# Patient Record
Sex: Female | Born: 1975 | Race: Black or African American | Hispanic: No | Marital: Single | Smoking: Current every day smoker
Health system: Southern US, Community
[De-identification: ages and names within clinical notes are randomized; demographics above are authoritative.]

## PROBLEM LIST (undated history)

## (undated) DIAGNOSIS — K219 Gastro-esophageal reflux disease without esophagitis: Secondary | ICD-10-CM

## (undated) DIAGNOSIS — F32A Depression, unspecified: Secondary | ICD-10-CM

## (undated) DIAGNOSIS — M1990 Unspecified osteoarthritis, unspecified site: Secondary | ICD-10-CM

## (undated) DIAGNOSIS — E785 Hyperlipidemia, unspecified: Secondary | ICD-10-CM

## (undated) DIAGNOSIS — F419 Anxiety disorder, unspecified: Secondary | ICD-10-CM

## (undated) DIAGNOSIS — E119 Type 2 diabetes mellitus without complications: Secondary | ICD-10-CM

## (undated) DIAGNOSIS — R87629 Unspecified abnormal cytological findings in specimens from vagina: Secondary | ICD-10-CM

## (undated) DIAGNOSIS — J449 Chronic obstructive pulmonary disease, unspecified: Secondary | ICD-10-CM

## (undated) HISTORY — DX: Anxiety disorder, unspecified: F41.9

## (undated) HISTORY — DX: Unspecified osteoarthritis, unspecified site: M19.90

## (undated) HISTORY — DX: Gastro-esophageal reflux disease without esophagitis: K21.9

## (undated) HISTORY — DX: Type 2 diabetes mellitus without complications: E11.9

## (undated) HISTORY — PX: STRABISMUS SURGERY: SHX218

## (undated) HISTORY — DX: Unspecified abnormal cytological findings in specimens from vagina: R87.629

## (undated) HISTORY — DX: Chronic obstructive pulmonary disease, unspecified: J44.9

## (undated) HISTORY — DX: Hyperlipidemia, unspecified: E78.5

## (undated) HISTORY — DX: Depression, unspecified: F32.A

---

## 1999-01-23 ENCOUNTER — Encounter: Payer: Self-pay | Admitting: Emergency Medicine

## 1999-01-23 ENCOUNTER — Emergency Department (HOSPITAL_COMMUNITY): Admission: EM | Admit: 1999-01-23 | Discharge: 1999-01-23 | Payer: Self-pay | Admitting: Emergency Medicine

## 2000-07-03 ENCOUNTER — Encounter: Payer: Self-pay | Admitting: *Deleted

## 2000-07-03 ENCOUNTER — Inpatient Hospital Stay (HOSPITAL_COMMUNITY): Admission: EM | Admit: 2000-07-03 | Discharge: 2000-07-04 | Payer: Self-pay | Admitting: *Deleted

## 2004-11-14 ENCOUNTER — Emergency Department: Payer: Self-pay | Admitting: Internal Medicine

## 2005-05-21 ENCOUNTER — Emergency Department: Payer: Self-pay | Admitting: Emergency Medicine

## 2005-11-24 ENCOUNTER — Emergency Department (HOSPITAL_COMMUNITY): Admission: EM | Admit: 2005-11-24 | Discharge: 2005-11-24 | Payer: Self-pay | Admitting: Emergency Medicine

## 2007-08-18 ENCOUNTER — Emergency Department: Payer: Self-pay | Admitting: Emergency Medicine

## 2007-10-12 ENCOUNTER — Emergency Department: Payer: Self-pay | Admitting: Emergency Medicine

## 2008-01-01 ENCOUNTER — Observation Stay: Payer: Self-pay

## 2008-02-06 ENCOUNTER — Observation Stay: Payer: Self-pay | Admitting: Obstetrics & Gynecology

## 2008-02-20 ENCOUNTER — Observation Stay: Payer: Self-pay

## 2008-03-28 ENCOUNTER — Observation Stay: Payer: Self-pay | Admitting: Unknown Physician Specialty

## 2008-04-02 ENCOUNTER — Inpatient Hospital Stay: Payer: Self-pay

## 2009-06-22 ENCOUNTER — Emergency Department: Payer: Self-pay | Admitting: Unknown Physician Specialty

## 2009-08-30 ENCOUNTER — Emergency Department: Payer: Self-pay

## 2011-01-26 ENCOUNTER — Emergency Department: Payer: Self-pay | Admitting: Emergency Medicine
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
## 2011-11-22 ENCOUNTER — Emergency Department: Payer: Self-pay | Admitting: Emergency Medicine

## 2012-01-04 ENCOUNTER — Ambulatory Visit: Payer: Self-pay | Admitting: Family Medicine

## 2012-04-11 ENCOUNTER — Emergency Department: Payer: Self-pay | Admitting: Emergency Medicine

## 2012-04-11 LAB — URINALYSIS, COMPLETE
Bilirubin,UR: NEGATIVE
Glucose,UR: NEGATIVE mg/dL (ref 0–75)
Ketone: NEGATIVE
Nitrite: NEGATIVE
Ph: 6 (ref 4.5–8.0)
Protein: NEGATIVE
RBC,UR: <1 /HPF (ref 0–5)
Specific Gravity: 1.005 (ref 1.003–1.030)
Squamous Epithelial: 3
WBC UR: 1 /HPF (ref 0–5)

## 2012-04-11 LAB — CBC
HCT: 40.6 % (ref 35.0–47.0)
HGB: 13.8 g/dL (ref 12.0–16.0)
MCH: 33.1 pg (ref 26.0–34.0)
MCHC: 34 g/dL (ref 32.0–36.0)
MCV: 98 fL (ref 80–100)
Platelet: 216 10*3/uL (ref 150–440)
RBC: 4.17 10*6/uL (ref 3.80–5.20)
RDW: 12.6 % (ref 11.5–14.5)
WBC: 6.7 10*3/uL (ref 3.6–11.0)

## 2012-04-11 LAB — HCG, QUANTITATIVE, PREGNANCY: Beta Hcg, Quant.: <1 m[IU]/mL — ABNORMAL LOW

## 2014-03-27 IMAGING — US US PELV - US TRANSVAGINAL
1 series · 14 of 25 positions shown · non-contrast
Comparison: none

REASON FOR EXAM: Enlarged uterus
COMMENTS:

[Series 1: us pelv - us transvaginal · 0.28mm/px · 14 of 71 slices shown]
[im 1/71]
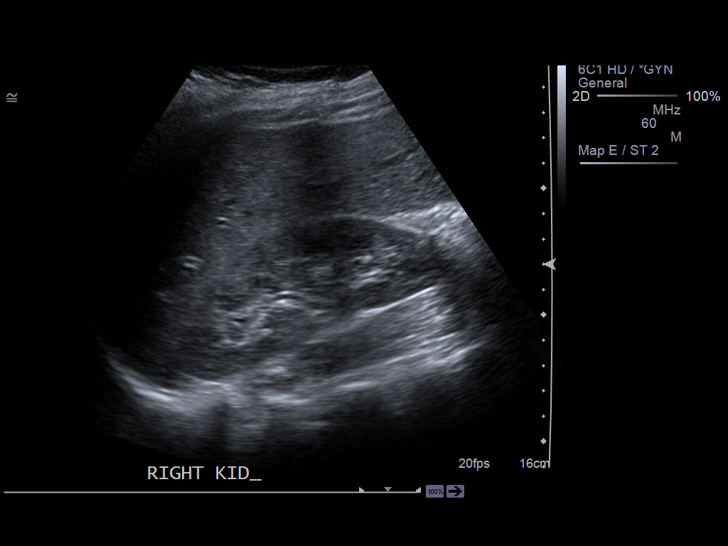
[im 6/71]
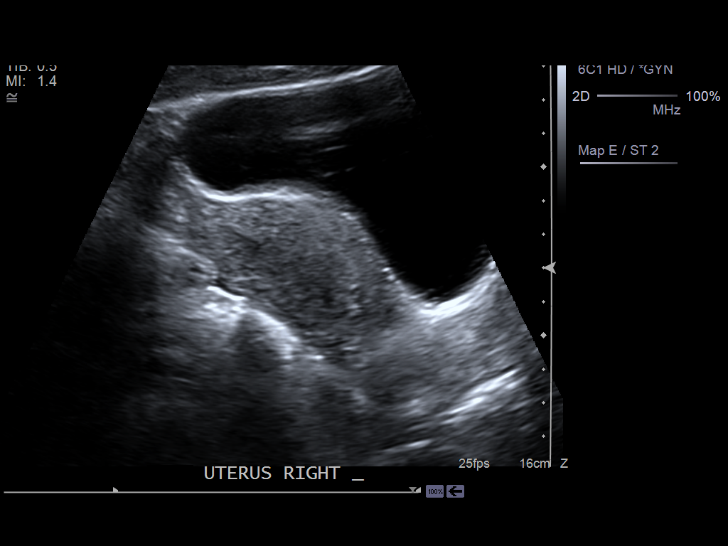
[im 12/71]
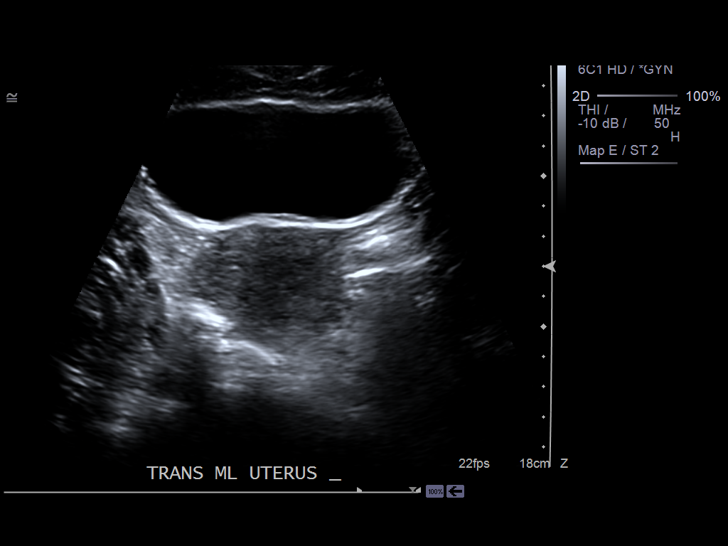
[im 18/71]
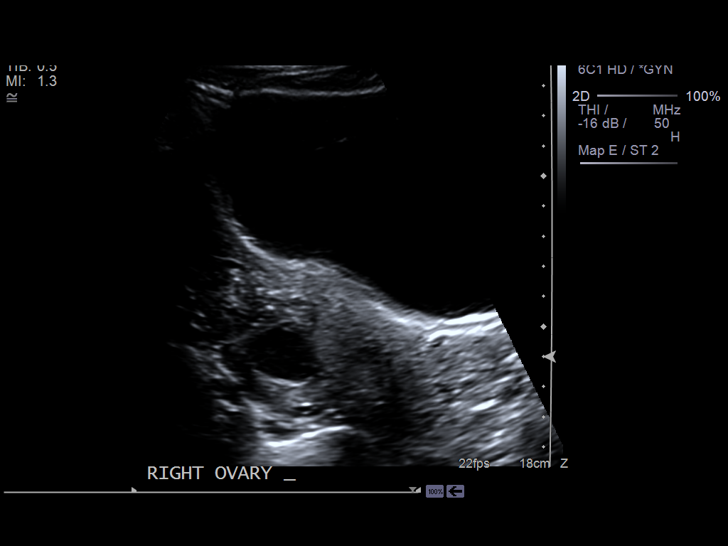
[im 24/71]
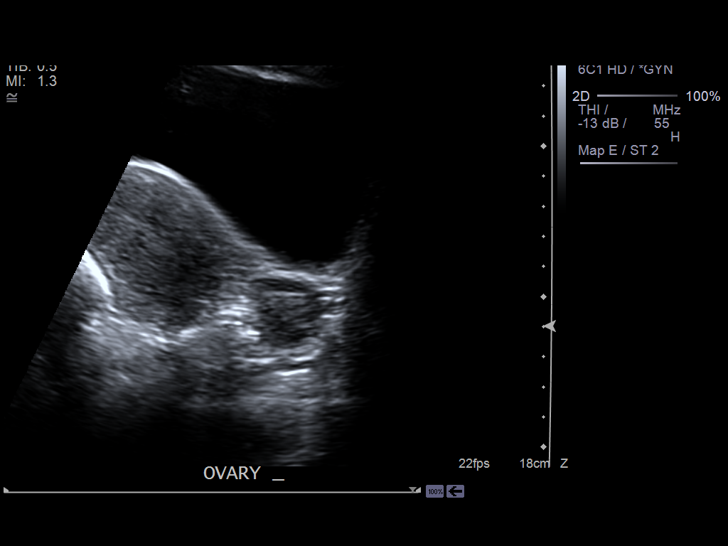
[im 27/71]
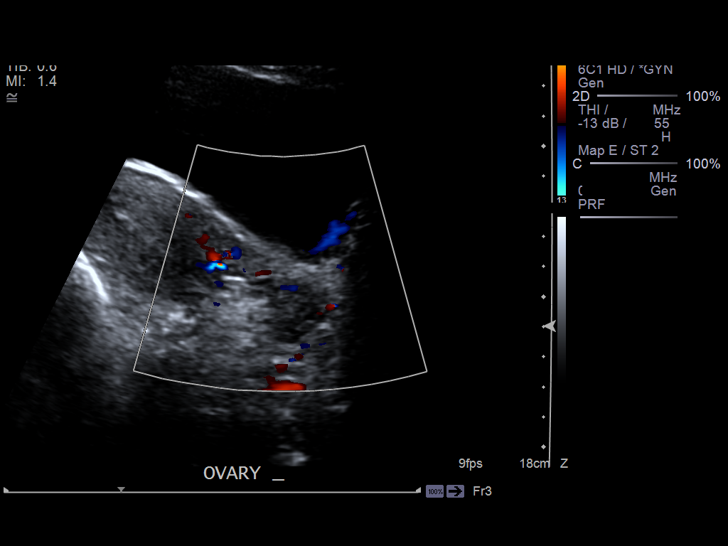
[im 33/71]
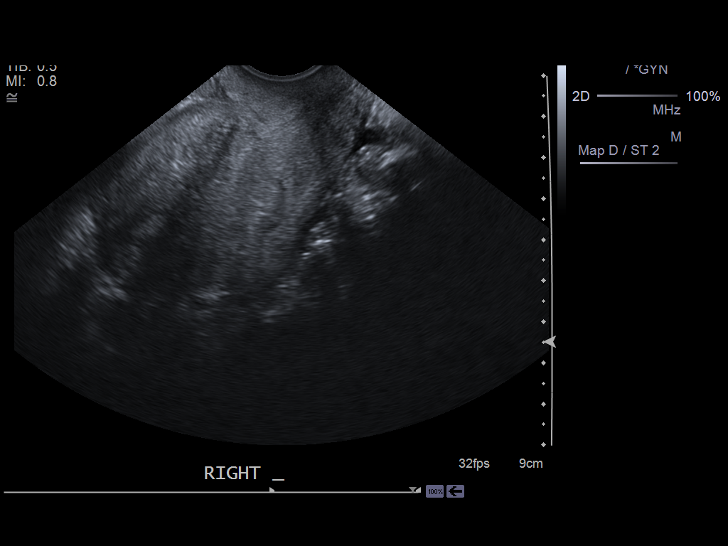
[im 38/71]
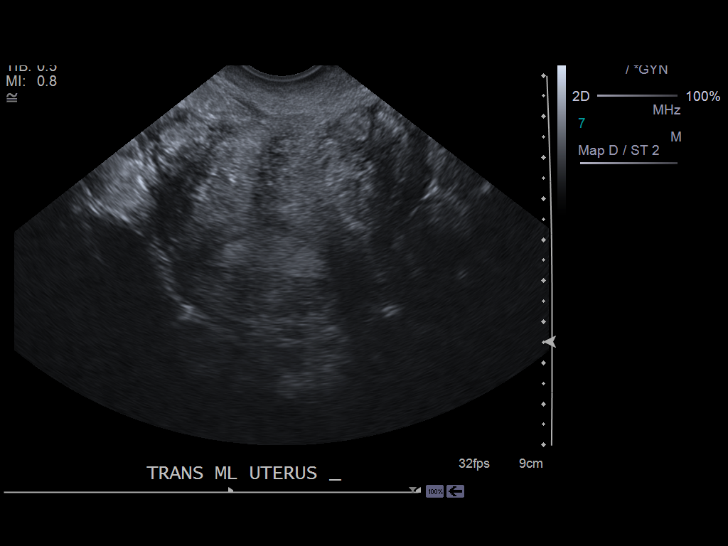
[im 44/71]
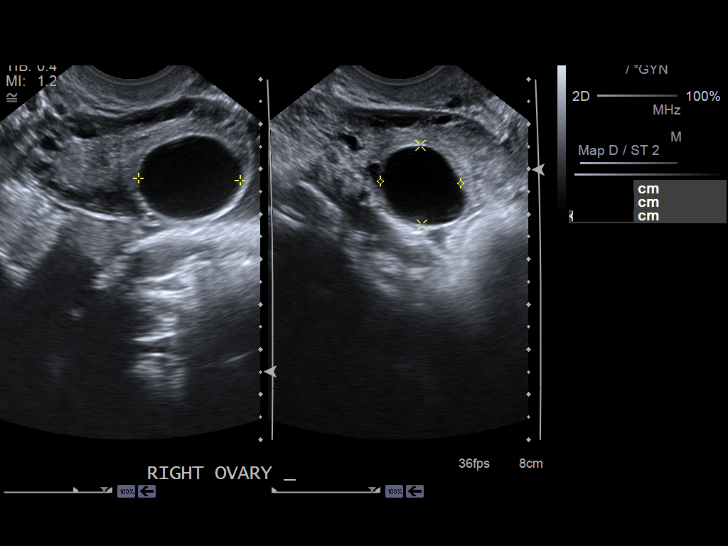
[im 47/71]
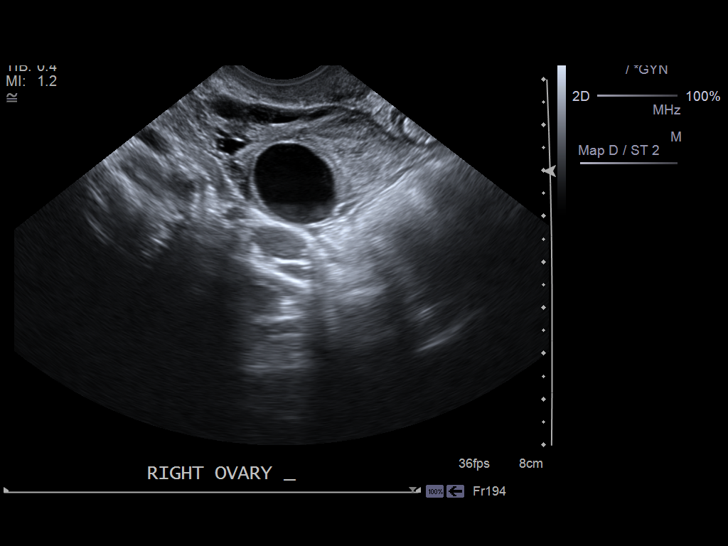
[im 53/71]
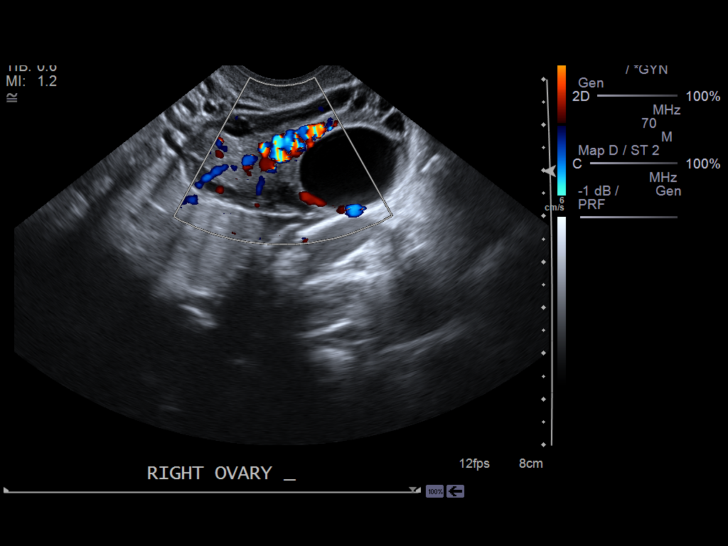
[im 59/71]
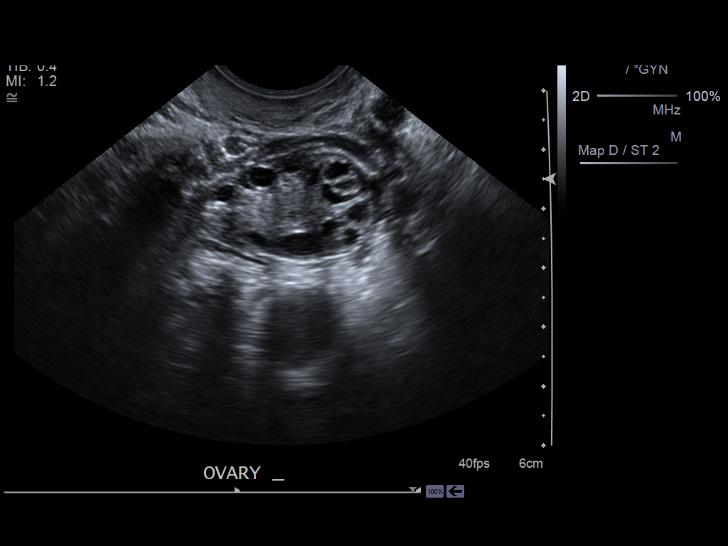
[im 65/71]
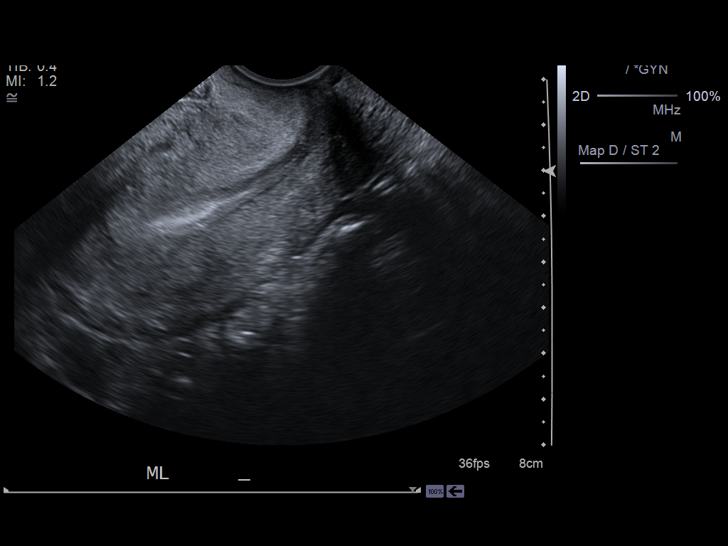
[im 71/71]
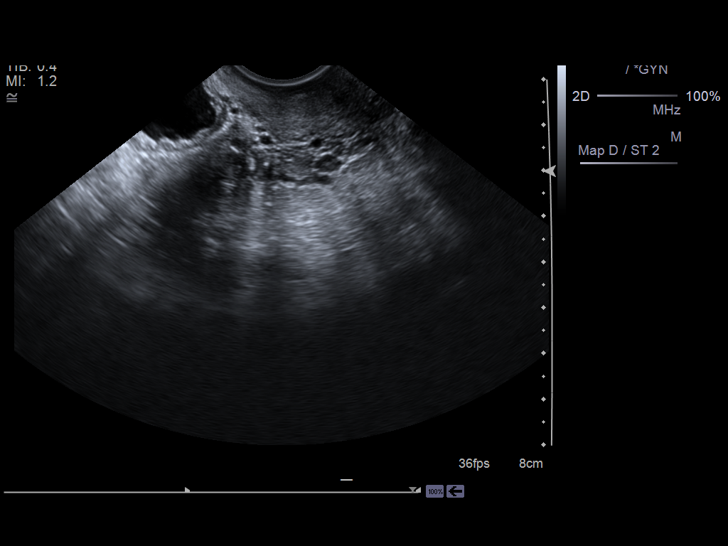

[14 of 25 positions shown; findings below may reference images not displayed]

PROCEDURE:     US  - US PELVIS EXAM W/TRANSVAGINAL  - January 04, 2012  [DATE]

RESULT:     Pelvic ultrasound reveals normal uterus measuring 9.8 x 5.6 x
3.9 cm with normal  endometrial thickness of 4.5 mm. Trace amount of fluid
noted in the pelvis. No significant ovarian abnormality identified. Normal
ovarian flow noted. Multiple bilateral follicular cysts noted with a
dominant follicular cyst in the right ovary measuring 2.3 cm. No
hydronephrosis.
IMPRESSION: No significant abnormality.

## 2023-03-18 ENCOUNTER — Telehealth: Payer: Self-pay | Admitting: Family Medicine

## 2023-03-18 NOTE — Telephone Encounter (Signed)
Tried to call the number back but unable to leave VM due to mailbox is full.  Dm/cma

## 2023-03-18 NOTE — Telephone Encounter (Signed)
Please call the pt she is concerned about her insurance ,medical records coming from [place] and her medication being refilled

## 2023-03-19 NOTE — Telephone Encounter (Signed)
I called and spoke with patient and she had concerns with her insurance starting on 03-21-23 and her appointment on 03-23-2023 could she be billed for services. I told her co-pay is due at time of service and will let speak more with our front staff. I spoke with Yolanda Olson- our front staff and asked her to talk with patient regarding this.

## 2023-03-23 ENCOUNTER — Other Ambulatory Visit (INDEPENDENT_AMBULATORY_CARE_PROVIDER_SITE_OTHER): Payer: Self-pay

## 2023-03-23 ENCOUNTER — Ambulatory Visit (INDEPENDENT_AMBULATORY_CARE_PROVIDER_SITE_OTHER): Payer: Self-pay | Admitting: Family Medicine

## 2023-03-23 ENCOUNTER — Encounter: Payer: Self-pay | Admitting: Family Medicine

## 2023-03-23 VITALS — BP 118/74 | HR 92 | Temp 97.7°F | Ht 67.25 in | Wt 174.6 lb

## 2023-03-23 DIAGNOSIS — I351 Nonrheumatic aortic (valve) insufficiency: Secondary | ICD-10-CM | POA: Insufficient documentation

## 2023-03-23 DIAGNOSIS — R87810 Cervical high risk human papillomavirus (HPV) DNA test positive: Secondary | ICD-10-CM

## 2023-03-23 DIAGNOSIS — I10 Essential (primary) hypertension: Secondary | ICD-10-CM

## 2023-03-23 DIAGNOSIS — G8929 Other chronic pain: Secondary | ICD-10-CM

## 2023-03-23 DIAGNOSIS — J4489 Other specified chronic obstructive pulmonary disease: Secondary | ICD-10-CM | POA: Insufficient documentation

## 2023-03-23 DIAGNOSIS — F419 Anxiety disorder, unspecified: Secondary | ICD-10-CM | POA: Insufficient documentation

## 2023-03-23 DIAGNOSIS — F259 Schizoaffective disorder, unspecified: Secondary | ICD-10-CM | POA: Insufficient documentation

## 2023-03-23 DIAGNOSIS — M1652 Unilateral post-traumatic osteoarthritis, left hip: Secondary | ICD-10-CM

## 2023-03-23 DIAGNOSIS — M169 Osteoarthritis of hip, unspecified: Secondary | ICD-10-CM | POA: Insufficient documentation

## 2023-03-23 DIAGNOSIS — Z7984 Long term (current) use of oral hypoglycemic drugs: Secondary | ICD-10-CM

## 2023-03-23 DIAGNOSIS — G43819 Other migraine, intractable, without status migrainosus: Secondary | ICD-10-CM | POA: Insufficient documentation

## 2023-03-23 DIAGNOSIS — M1612 Unilateral primary osteoarthritis, left hip: Secondary | ICD-10-CM | POA: Insufficient documentation

## 2023-03-23 DIAGNOSIS — K219 Gastro-esophageal reflux disease without esophagitis: Secondary | ICD-10-CM | POA: Insufficient documentation

## 2023-03-23 DIAGNOSIS — F909 Attention-deficit hyperactivity disorder, unspecified type: Secondary | ICD-10-CM | POA: Insufficient documentation

## 2023-03-23 DIAGNOSIS — F5101 Primary insomnia: Secondary | ICD-10-CM | POA: Insufficient documentation

## 2023-03-23 DIAGNOSIS — F17200 Nicotine dependence, unspecified, uncomplicated: Secondary | ICD-10-CM

## 2023-03-23 DIAGNOSIS — E1169 Type 2 diabetes mellitus with other specified complication: Secondary | ICD-10-CM | POA: Insufficient documentation

## 2023-03-23 DIAGNOSIS — N920 Excessive and frequent menstruation with regular cycle: Secondary | ICD-10-CM

## 2023-03-23 DIAGNOSIS — I517 Cardiomegaly: Secondary | ICD-10-CM | POA: Insufficient documentation

## 2023-03-23 DIAGNOSIS — M5442 Lumbago with sciatica, left side: Secondary | ICD-10-CM

## 2023-03-23 DIAGNOSIS — E785 Hyperlipidemia, unspecified: Secondary | ICD-10-CM | POA: Insufficient documentation

## 2023-03-23 LAB — URINALYSIS, ROUTINE W REFLEX MICROSCOPIC
Bilirubin Urine: NEGATIVE
Hgb urine dipstick: NEGATIVE
Ketones, ur: NEGATIVE
Leukocytes,Ua: NEGATIVE
Nitrite: NEGATIVE
RBC / HPF: NONE SEEN (ref 0–?)
Specific Gravity, Urine: 1.02 (ref 1.000–1.030)
Total Protein, Urine: NEGATIVE
Urine Glucose: NEGATIVE
Urobilinogen, UA: 0.2 (ref 0.0–1.0)
WBC, UA: NONE SEEN (ref 0–?)
pH: 6 (ref 5.0–8.0)

## 2023-03-23 LAB — MICROALBUMIN / CREATININE URINE RATIO
Creatinine,U: 64.3 mg/dL
Microalb Creat Ratio: 1.1 mg/g (ref 0.0–30.0)
Microalb, Ur: <0.7 mg/dL (ref 0.0–1.9)

## 2023-03-23 MED ORDER — VARENICLINE TARTRATE 0.5 MG PO TABS
ORAL_TABLET | ORAL | 0 refills | Status: AC
Start: 2023-03-23 — End: 2023-03-30

## 2023-03-23 MED ORDER — VARENICLINE TARTRATE 1 MG PO TABS
1.0000 mg | ORAL_TABLET | Freq: Two times a day (BID) | ORAL | 5 refills | Status: DC
Start: 2023-03-23 — End: 2023-08-20

## 2023-03-23 MED ORDER — OXYCODONE HCL 10 MG PO TABS
10.0000 mg | ORAL_TABLET | Freq: Three times a day (TID) | ORAL | 0 refills | Status: DC
Start: 2023-03-23 — End: 2023-04-23

## 2023-03-23 MED ORDER — AMPHETAMINE-DEXTROAMPHETAMINE 30 MG PO TABS
30.0000 mg | ORAL_TABLET | Freq: Two times a day (BID) | ORAL | 0 refills | Status: DC
Start: 2023-03-23 — End: 2023-04-16

## 2023-03-23 NOTE — Progress Notes (Deleted)
[facility] [address] [place] [address] Dept: [phone] Dept Fax: [phone]  Annual Physical Visit  Subjective:    Patient ID: Yolanda Olson, female    DOB: 1976-03-31, 47 y.o..   MRN: [id number]  Chief Complaint  Patient presents with   Establish Care    NP-establish care.     History of Present Illness:  Patient is in today for an annual physical/preventative visit.  Past Medical History: Patient Active Problem List   Diagnosis Date Noted   Chronic low back pain with left-sided sciatica 03/23/2023   Asthma-chronic obstructive pulmonary disease overlap syndrome 03/23/2023   Other migraine, intractable, without status migrainosus 03/23/2023   Essential hypertension 03/23/2023   Type 2 diabetes mellitus with other specified complication (HCC) 03/23/2023   Dyslipidemia 03/23/2023   Tobacco dependence 03/23/2023   Schizoaffective disorder (HCC) 03/23/2023   Primary insomnia 03/23/2023   Cervical high risk HPV (human papillomavirus) test positive 03/23/2023   Anxiety 03/23/2023   Adult ADHD 03/23/2023   GERD (gastroesophageal reflux disease) 03/23/2023   Mild concentric left ventricular hypertrophy (LVH) 03/23/2023   Aortic regurgitation 03/23/2023   Osteoarthritis of left hip 03/23/2023   Menorrhagia 03/23/2023   Past Surgical History:  Procedure Laterality Date   EYE SURGERY     Family History  Problem Relation Age of Onset   Hypertension Mother    Hyperlipidemia Mother    Diabetes Mother    Cancer Father        lung   Diabetes Father    Outpatient Medications Prior to Visit  Medication Sig Dispense Refill   albuterol (VENTOLIN HFA) 108 (90 Base) MCG/ACT inhaler Inhale 2 puffs into the lungs every 6 (six) hours as needed for wheezing or shortness of breath.     atorvastatin (LIPITOR) 40 MG tablet Take 40 mg by mouth daily.     budesonide-formoterol (SYMBICORT) 80-4.5 MCG/ACT inhaler Inhale 2  puffs into the lungs 2 (two) times daily.     cyclobenzaprine (FLEXERIL) 10 MG tablet Take 10 mg by mouth daily as needed for muscle spasms.     FLUoxetine (PROZAC) 40 MG capsule Take 40 mg by mouth daily.     gabapentin (NEURONTIN) 300 MG capsule Take 300 mg by mouth 3 (three) times daily.     hydrOXYzine (ATARAX) 25 MG tablet Take 25 mg by mouth every 6 (six) hours as needed for anxiety.     lisinopril (ZESTRIL) 2.5 MG tablet Take 2.5 mg by mouth daily.     meloxicam (MOBIC) 15 MG tablet Take 15 mg by mouth daily.     metFORMIN (GLUCOPHAGE-XR) 500 MG 24 hr tablet Take 500 mg by mouth 2 (two) times daily with a meal.     metroNIDAZOLE (FLAGYL) 500 MG tablet Take 500 mg by mouth 2 (two) times daily.     norethindrone (AYGESTIN) 5 MG tablet Take 5 mg by mouth daily.     omeprazole (PRILOSEC) 40 MG capsule Take 40 mg by mouth daily.     tiotropium (SPIRIVA) 18 MCG inhalation capsule Place 18 mcg into inhaler and inhale daily.     zolpidem (AMBIEN) 10 MG tablet Take 10 mg by mouth at bedtime as needed for sleep.     Oxycodone HCl 10 MG TABS Take 10 mg by mouth 4 (four) times daily.     No facility-administered medications prior to visit.   Allergies  Allergen Reactions   Aspirin Other (See Comments)    Stomach irritation  Objective:   Today's Vitals   03/23/23 1053  BP: 118/74  Pulse: 92  Temp: 97.7 F (36.5 C)  TempSrc: Temporal  SpO2: 100%  Weight: 174 lb 9.6 oz (79.2 kg)  Height: 5' 7.25" (1.708 m)   Body mass index is 27.14 kg/m.   General: Well developed, well nourished. No acute distress. HEENT: Normocephalic, non-traumatic. PERRL, EOMI. Conjunctiva clear. External ears normal. EAC and TMs normal bilaterally. Nose    clear without congestion or rhinorrhea. Mucous membranes moist. Oropharynx clear. Good dentition. Neck: Supple. No lymphadenopathy. No thyromegaly. Lungs: Clear to auscultation bilaterally. No wheezing, rales or rhonchi. CV: RRR without murmurs or rubs.  Pulses 2+ bilaterally. Abdomen: Soft, non-tender. Bowel sounds positive, normal pitch and frequency. No hepatosplenomegaly. No rebound or guarding. Back: Straight. No CVA tenderness bilaterally. Extremities: Full ROM. No joint swelling or tenderness. No edema noted. Skin: Warm and dry. No rashes. Neuro: CN II-XII intact. Normal sensation and DTR bilaterally. Psych: Alert and oriented. Normal mood and affect.  Health Maintenance Due  Topic Date Due   HEMOGLOBIN A1C  Never done   FOOT EXAM  Never done   OPHTHALMOLOGY EXAM  Never done   HIV Screening  Never done   Diabetic kidney evaluation - eGFR measurement  Never done   Diabetic kidney evaluation - Urine ACR  Never done   Hepatitis C Screening  Never done   PAP SMEAR-Modifier  Never done   DTaP/Tdap/Td (4 - Td or Tdap) 09/21/2016   INFLUENZA VACCINE  03/18/2023    Lab Results {Labs (Optional):29002}    Assessment & Plan:   Problem List Items Addressed This Visit       Cardiovascular and Mediastinum   Essential hypertension - Primary   Relevant Medications   atorvastatin (LIPITOR) 40 MG tablet   lisinopril (ZESTRIL) 2.5 MG tablet     Endocrine   Type 2 diabetes mellitus with other specified complication (HCC)   Relevant Medications   atorvastatin (LIPITOR) 40 MG tablet   lisinopril (ZESTRIL) 2.5 MG tablet   metFORMIN (GLUCOPHAGE-XR) 500 MG 24 hr tablet   Other Relevant Orders   Lipid panel   Microalbumin / creatinine urine ratio   Basic metabolic panel   Hemoglobin A1c   Urinalysis, Routine w reflex microscopic     Nervous and Auditory   Chronic low back pain with left-sided sciatica   Relevant Medications   hydrOXYzine (ATARAX) 25 MG tablet   gabapentin (NEURONTIN) 300 MG capsule   cyclobenzaprine (FLEXERIL) 10 MG tablet   FLUoxetine (PROZAC) 40 MG capsule   zolpidem (AMBIEN) 10 MG tablet   meloxicam (MOBIC) 15 MG tablet   Oxycodone HCl 10 MG TABS   amphetamine-dextroamphetamine (ADDERALL) 30 MG tablet    varenicline (CHANTIX) 0.5 MG tablet   varenicline (CHANTIX) 1 MG tablet   Other Relevant Orders   Urine drugs of abuse scrn w alc, routine ([facility] [place] [facility])   Ambulatory referral to Orthopedics   Ambulatory referral to Pain Clinic     Musculoskeletal and Integument   Osteoarthritis of left hip   Relevant Medications   cyclobenzaprine (FLEXERIL) 10 MG tablet   meloxicam (MOBIC) 15 MG tablet   Oxycodone HCl 10 MG TABS   Other Relevant Orders   Urine drugs of abuse scrn w alc, routine ([facility] [place] [facility])   Ambulatory referral to Orthopedics   Ambulatory referral to Pain Clinic     Other   Adult ADHD   Relevant Medications   amphetamine-dextroamphetamine (  ADDERALL) 30 MG tablet   Cervical high risk HPV (human papillomavirus) test positive   Relevant Orders   Ambulatory referral to Obstetrics / Gynecology   Menorrhagia   Relevant Orders   Ambulatory referral to Obstetrics / Gynecology   Tobacco dependence   Relevant Medications   varenicline (CHANTIX) 0.5 MG tablet   varenicline (CHANTIX) 1 MG tablet    Return in about 3 months (around 06/23/2023) for Reassessment.   Loyola Mast, MD

## 2023-03-23 NOTE — Assessment & Plan Note (Signed)
I will plan to refer her to a pain clinic regarding her ongoing COTS. I will bridge her for now with oxycodone. I did review records form ehr previous provider, Dr. Brooke Dare, confirming that she has been on this medication. I will check a UDS today.

## 2023-03-23 NOTE — Assessment & Plan Note (Signed)
Blood pressure is in good control. Continue lisinopril 2.5 mg daily.
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
## 2023-03-23 NOTE — Assessment & Plan Note (Signed)
I will continue her Adderall 30 mg bid, confirmed by review of prior records.

## 2023-03-23 NOTE — Assessment & Plan Note (Signed)
I support her desire to quit smoking. I will prescribed Chantix.

## 2023-03-23 NOTE — Assessment & Plan Note (Signed)
Continue norethindrone. I will refer Ms. Dubbs to establish with a GYN locally.

## 2023-03-23 NOTE — Assessment & Plan Note (Signed)
I will check annual DM labs today. We will continue metformin 500 mg 2 tabs bid. She is interested in doing her eye screen in Oct. here in our clinic.

## 2023-03-23 NOTE — Assessment & Plan Note (Addendum)
I will refer her to establish with a local orthopedist regarding her chronic hip arthritis. Pain management as noted above.

## 2023-03-25 ENCOUNTER — Telehealth: Payer: Self-pay | Admitting: Family Medicine

## 2023-03-25 MED ORDER — FREESTYLE LITE TEST VI STRP: 1.0000 | ORAL_STRIP | 3 refills | Status: AC | PRN

## 2023-03-25 NOTE — Telephone Encounter (Signed)
Prescription Request  03/25/2023  LOV: 03/23/2023  What is the name of the medication or equipment? Freestyle Test strips 100ct (Diabetic test strip)  Have you contacted your pharmacy to request a refill? No   Which pharmacy would you like this sent to?  [facility] [phone] - [place], [place] - [address] AT [facility] [address] [place] [address] Phone: [phone] Fax: [phone]    Patient notified that their request is being sent to the clinical staff for review and that they should receive a response within 2 business days.   Please advise at Mobile [phone] (mobile)

## 2023-03-25 NOTE — Telephone Encounter (Signed)
RX sent to the pharmacy. Dm/cma  

## 2023-03-30 ENCOUNTER — Telehealth: Payer: Self-pay | Admitting: Family Medicine

## 2023-03-30 NOTE — Telephone Encounter (Signed)
Pt is needing the frequency added to her glucose blood (FREESTYLE LITE) test strip [[id number]]. They will not fill it. Pt at [phone]   [id number] - [place], [place] - [address] AT [facility] OF [address] [place] [address] Phone: [phone]  Fax: [phone] DEA #: [id number]

## 2023-03-30 NOTE — Telephone Encounter (Signed)
Called pharmacy and gave verbal to check sugars once daily.  They will fill the prescription.  Dm/cma

## 2023-04-05 ENCOUNTER — Ambulatory Visit: Payer: Self-pay | Admitting: Family Medicine

## 2023-04-08 ENCOUNTER — Ambulatory Visit: Payer: Medicaid Other | Admitting: Orthopaedic Surgery

## 2023-04-15 ENCOUNTER — Telehealth: Payer: Self-pay | Admitting: Family Medicine

## 2023-04-15 DIAGNOSIS — I10 Essential (primary) hypertension: Secondary | ICD-10-CM

## 2023-04-15 DIAGNOSIS — M1652 Unilateral post-traumatic osteoarthritis, left hip: Secondary | ICD-10-CM

## 2023-04-15 DIAGNOSIS — F419 Anxiety disorder, unspecified: Secondary | ICD-10-CM

## 2023-04-15 DIAGNOSIS — E1169 Type 2 diabetes mellitus with other specified complication: Secondary | ICD-10-CM

## 2023-04-15 DIAGNOSIS — K219 Gastro-esophageal reflux disease without esophagitis: Secondary | ICD-10-CM

## 2023-04-15 DIAGNOSIS — F5101 Primary insomnia: Secondary | ICD-10-CM

## 2023-04-15 DIAGNOSIS — J4489 Other specified chronic obstructive pulmonary disease: Secondary | ICD-10-CM

## 2023-04-15 DIAGNOSIS — N920 Excessive and frequent menstruation with regular cycle: Secondary | ICD-10-CM

## 2023-04-15 DIAGNOSIS — F909 Attention-deficit hyperactivity disorder, unspecified type: Secondary | ICD-10-CM

## 2023-04-15 DIAGNOSIS — G8929 Other chronic pain: Secondary | ICD-10-CM

## 2023-04-15 DIAGNOSIS — E785 Hyperlipidemia, unspecified: Secondary | ICD-10-CM

## 2023-04-15 NOTE — Telephone Encounter (Signed)
Pt is requesting that all her meds on her med list be called in to [facility] on [address].  They have told her they have none on file because they were all [place] of [place].

## 2023-04-15 NOTE — Telephone Encounter (Signed)
Called patient and she needs refills on all her medication including if you would please send the Adderall in so she doesn't have to call back in a week.  Please review message and advise. Dm/cma

## 2023-04-15 NOTE — Telephone Encounter (Signed)
[place] [phone]  Pt wants her referral to go to [facility]  Fax# [phone] Gus Puma She has requested a call because she is almost out of her pain meds.  She now has one to [facility].

## 2023-04-16 MED ORDER — BUDESONIDE-FORMOTEROL FUMARATE 80-4.5 MCG/ACT IN AERO: 2.0000 | INHALATION_SPRAY | Freq: Two times a day (BID) | RESPIRATORY_TRACT | 11 refills | Status: DC

## 2023-04-16 MED ORDER — OMEPRAZOLE 40 MG PO CPDR
40.0000 mg | DELAYED_RELEASE_CAPSULE | Freq: Every day | ORAL | 3 refills | Status: AC
Start: 2023-04-16 — End: ?

## 2023-04-16 MED ORDER — AMPHETAMINE-DEXTROAMPHETAMINE 30 MG PO TABS
30.0000 mg | ORAL_TABLET | Freq: Two times a day (BID) | ORAL | 0 refills | Status: AC
Start: 2023-04-16 — End: ?

## 2023-04-16 MED ORDER — ALBUTEROL SULFATE HFA 108 (90 BASE) MCG/ACT IN AERS
2.0000 | INHALATION_SPRAY | Freq: Four times a day (QID) | RESPIRATORY_TRACT | 5 refills | Status: DC | PRN
Start: 2023-04-16 — End: 2024-05-30

## 2023-04-16 MED ORDER — NORETHINDRONE ACETATE 5 MG PO TABS
5.0000 mg | ORAL_TABLET | Freq: Every day | ORAL | 3 refills | Status: DC
Start: 2023-04-16 — End: 2023-12-06

## 2023-04-16 MED ORDER — ZOLPIDEM TARTRATE 10 MG PO TABS
10.0000 mg | ORAL_TABLET | Freq: Every evening | ORAL | 1 refills | Status: AC | PRN
Start: 2023-04-16 — End: ?

## 2023-04-16 MED ORDER — LISINOPRIL 2.5 MG PO TABS
2.5000 mg | ORAL_TABLET | Freq: Every day | ORAL | 3 refills | Status: DC
Start: 2023-04-16 — End: 2024-06-19

## 2023-04-16 MED ORDER — CYCLOBENZAPRINE HCL 10 MG PO TABS
10.0000 mg | ORAL_TABLET | Freq: Every day | ORAL | 3 refills | Status: DC | PRN
Start: 2023-04-16 — End: 2023-12-13

## 2023-04-16 MED ORDER — ATORVASTATIN CALCIUM 40 MG PO TABS
40.0000 mg | ORAL_TABLET | Freq: Every day | ORAL | 3 refills | Status: DC
Start: 2023-04-16 — End: 2024-03-30

## 2023-04-16 MED ORDER — METFORMIN HCL ER 500 MG PO TB24
500.0000 mg | ORAL_TABLET | Freq: Two times a day (BID) | ORAL | 3 refills | Status: DC
Start: 2023-04-16 — End: 2024-06-19

## 2023-04-16 MED ORDER — TIOTROPIUM BROMIDE MONOHYDRATE 18 MCG IN CAPS
18.0000 ug | ORAL_CAPSULE | Freq: Every day | RESPIRATORY_TRACT | 3 refills | Status: DC
Start: 2023-04-16 — End: 2023-08-20

## 2023-04-16 MED ORDER — GABAPENTIN 300 MG PO CAPS
300.0000 mg | ORAL_CAPSULE | Freq: Three times a day (TID) | ORAL | 3 refills | Status: AC
Start: 2023-04-16 — End: ?

## 2023-04-16 MED ORDER — FLUOXETINE HCL 40 MG PO CAPS
40.0000 mg | ORAL_CAPSULE | Freq: Every day | ORAL | 3 refills | Status: DC
Start: 2023-04-16 — End: 2023-08-20

## 2023-04-16 NOTE — Telephone Encounter (Signed)
Referral has been sent to [facility], to the fax number provided.

## 2023-04-16 NOTE — Addendum Note (Signed)
Addended by: Loyola Mast on: 04/16/2023 08:22 AM   Modules accepted: Orders

## 2023-04-20 MED ORDER — BLOOD GLUCOSE METER KIT: 1.0000 | PACK | 0 refills | Status: DC

## 2023-04-20 MED ORDER — BLOOD GLUCOSE METER KIT: 1.0000 | PACK | 0 refills | Status: AC

## 2023-04-20 NOTE — Addendum Note (Signed)
Addended by: Mickle Plumb L on: 04/20/2023 12:00 PM   Modules accepted: Orders

## 2023-04-20 NOTE — Addendum Note (Signed)
Addended by: Mickle Plumb L on: 04/20/2023 12:01 PM   Modules accepted: Orders

## 2023-04-20 NOTE — Telephone Encounter (Signed)
Pt's insurance will not cover for her to get Freestyle Lite, they are wanting her to get the Accucoat machine with the test strips.

## 2023-04-23 MED ORDER — OXYCODONE HCL 10 MG PO TABS: 10.0000 mg | ORAL_TABLET | Freq: Three times a day (TID) | ORAL | 0 refills | Status: DC

## 2023-04-23 NOTE — Addendum Note (Signed)
Addended by: Loyola Mast on: 04/23/2023 03:02 PM   Modules accepted: Orders

## 2023-04-23 NOTE — Telephone Encounter (Signed)
Pt is saying [facility] does not have this referral. Please resend.     Pt is also wanting a refill on her Oxycodone HCl 10 MG TABS [[id number]]  [id number] - [place], [place] - [address] AT [facility] [address] [place] [address] Phone: [phone]  Fax: [phone] DEA #: [id number]   For one more month until she gets an appt with pain management.

## 2023-04-23 NOTE — Telephone Encounter (Signed)
Sherri: Can you re-send the referral?  Dr Veto Kemps: Refill on Oxycodone.   Please review and advise.  Thanks Dm/cma

## 2023-04-23 NOTE — Telephone Encounter (Signed)
Patient notified VIA phone.   She is now asking for a refill on Ozempic 1mg .   Please review and advise.  Thanks. Dm/cma

## 2023-04-26 ENCOUNTER — Other Ambulatory Visit: Payer: Self-pay

## 2023-04-26 DIAGNOSIS — M1652 Unilateral post-traumatic osteoarthritis, left hip: Secondary | ICD-10-CM

## 2023-04-26 DIAGNOSIS — G8929 Other chronic pain: Secondary | ICD-10-CM

## 2023-04-26 NOTE — Telephone Encounter (Signed)
Resent

## 2023-04-26 NOTE — Telephone Encounter (Signed)
Patient  requesting Rx refill

## 2023-04-26 NOTE — Telephone Encounter (Signed)
Lft VM to rtn call. Dm/cma  

## 2023-04-27 ENCOUNTER — Telehealth: Payer: Self-pay

## 2023-04-27 NOTE — Telephone Encounter (Signed)
RX needed a PA.  It has been submitted through cover my meds.   Dm/cma

## 2023-04-27 NOTE — Telephone Encounter (Signed)
Lft VM to rtn call. Dm/cma  

## 2023-04-27 NOTE — Telephone Encounter (Signed)
PA for Oxycodone submitted through cover my meds.  Awaiting response.  Dm/cma  Key: [id number]

## 2023-04-28 NOTE — Telephone Encounter (Signed)
Lft VM to rtn call. Dm/cma  

## 2023-05-05 LAB — LAB REPORT - SCANNED
A1c: 5.6
Creatinine, POC: 57.5 mg/dL
EGFR: 154
HM Hepatitis Screen: NEGATIVE

## 2023-05-20 ENCOUNTER — Telehealth: Payer: Self-pay

## 2023-05-20 ENCOUNTER — Encounter: Payer: Self-pay | Admitting: Family Medicine

## 2023-05-20 ENCOUNTER — Ambulatory Visit: Payer: Medicaid Other | Admitting: Family Medicine

## 2023-05-20 VITALS — BP 134/86 | HR 90 | Temp 97.2°F | Ht 67.25 in | Wt 176.6 lb

## 2023-05-20 DIAGNOSIS — E1169 Type 2 diabetes mellitus with other specified complication: Secondary | ICD-10-CM | POA: Diagnosis not present

## 2023-05-20 DIAGNOSIS — T7491XA Unspecified adult maltreatment, confirmed, initial encounter: Secondary | ICD-10-CM

## 2023-05-20 DIAGNOSIS — Z3202 Encounter for pregnancy test, result negative: Secondary | ICD-10-CM

## 2023-05-20 DIAGNOSIS — I10 Essential (primary) hypertension: Secondary | ICD-10-CM | POA: Diagnosis not present

## 2023-05-20 DIAGNOSIS — M5442 Lumbago with sciatica, left side: Secondary | ICD-10-CM | POA: Diagnosis not present

## 2023-05-20 DIAGNOSIS — N760 Acute vaginitis: Secondary | ICD-10-CM

## 2023-05-20 DIAGNOSIS — Z7984 Long term (current) use of oral hypoglycemic drugs: Secondary | ICD-10-CM

## 2023-05-20 DIAGNOSIS — B9689 Other specified bacterial agents as the cause of diseases classified elsewhere: Secondary | ICD-10-CM | POA: Insufficient documentation

## 2023-05-20 DIAGNOSIS — M170 Bilateral primary osteoarthritis of knee: Secondary | ICD-10-CM

## 2023-05-20 DIAGNOSIS — M1652 Unilateral post-traumatic osteoarthritis, left hip: Secondary | ICD-10-CM

## 2023-05-20 DIAGNOSIS — Z7985 Long-term (current) use of injectable non-insulin antidiabetic drugs: Secondary | ICD-10-CM

## 2023-05-20 DIAGNOSIS — K219 Gastro-esophageal reflux disease without esophagitis: Secondary | ICD-10-CM

## 2023-05-20 DIAGNOSIS — E785 Hyperlipidemia, unspecified: Secondary | ICD-10-CM

## 2023-05-20 DIAGNOSIS — G8929 Other chronic pain: Secondary | ICD-10-CM

## 2023-05-20 DIAGNOSIS — F909 Attention-deficit hyperactivity disorder, unspecified type: Secondary | ICD-10-CM

## 2023-05-20 LAB — POCT URINE PREGNANCY: Preg Test, Ur: NEGATIVE

## 2023-05-20 MED ORDER — AMPHETAMINE-DEXTROAMPHETAMINE 30 MG PO TABS
30.0000 mg | ORAL_TABLET | Freq: Two times a day (BID) | ORAL | 0 refills | Status: DC
Start: 2023-05-20 — End: 2023-06-18

## 2023-05-20 MED ORDER — METRONIDAZOLE 500 MG PO TABS
500.0000 mg | ORAL_TABLET | Freq: Two times a day (BID) | ORAL | 2 refills | Status: DC
Start: 2023-05-20 — End: 2023-08-20

## 2023-05-20 MED ORDER — OZEMPIC (1 MG/DOSE) 4 MG/3ML ~~LOC~~ SOPN
1.0000 mg | PEN_INJECTOR | SUBCUTANEOUS | 3 refills | Status: DC
Start: 2023-05-20 — End: 2024-03-30

## 2023-05-20 MED ORDER — OMEPRAZOLE 40 MG PO CPDR: 40.0000 mg | DELAYED_RELEASE_CAPSULE | Freq: Every day | ORAL | 3 refills | Status: DC
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
## 2023-05-20 NOTE — Assessment & Plan Note (Signed)
Lipids are at goal. Continue atorvastatin 40 mg daily.

## 2023-05-20 NOTE — Assessment & Plan Note (Signed)
A1c in good control. We will continue metformin 500 mg 2 tabs bid and Ozempic 1 mg weekly.
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
## 2023-05-20 NOTE — Assessment & Plan Note (Signed)
Patient notes issue with recurrent BV around the time of her menses. She takes metronidazole as needed for several days to stop the discharge. I recommend she discuss this with GYN once she establishes care.

## 2023-05-20 NOTE — Telephone Encounter (Signed)
Pt called the office to report she is ok.

## 2023-05-20 NOTE — Telephone Encounter (Signed)
[facility] spoke with pt otp. Pt advised them that she was safe and ok.

## 2023-05-20 NOTE — Assessment & Plan Note (Signed)
Patient would be very high risk for pregnancy due to advanced age, hypertension, diabetes, and chronic opioid therapy. I reviewed birth control options and recommended she consider non-hormonal approaches, such as Paraguard IUD or sterilization procedures. I provided her a handout of these options and recommended she discuss further with gynecology.

## 2023-05-20 NOTE — Assessment & Plan Note (Signed)
Continue to follow with [facility] clinic regarding COTS. I will try referring her back to orthopedics.

## 2023-05-20 NOTE — Assessment & Plan Note (Signed)
Reviewed x-ray report from [place]. Shows severe left hip arthritis. I will try and refer her again to orthopedics.
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
## 2023-05-20 NOTE — Assessment & Plan Note (Signed)
Reviewed x-ray report from [place]. Shows tricompartmental knee arthritis bilaterally. I will try and refer her again to orthopedics.

## 2023-05-20 NOTE — Assessment & Plan Note (Signed)
Stable. Continue Adderall 30 mg bid.
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
## 2023-05-20 NOTE — Assessment & Plan Note (Signed)
Stable.  Continue  omeprazole 40mg daily

## 2023-06-01 ENCOUNTER — Ambulatory Visit: Payer: Medicaid Other | Admitting: Orthopaedic Surgery

## 2023-06-18 ENCOUNTER — Other Ambulatory Visit: Payer: Self-pay | Admitting: Family Medicine

## 2023-06-18 DIAGNOSIS — F909 Attention-deficit hyperactivity disorder, unspecified type: Secondary | ICD-10-CM

## 2023-06-18 MED ORDER — AMPHETAMINE-DEXTROAMPHETAMINE 30 MG PO TABS: 30.0000 mg | ORAL_TABLET | Freq: Two times a day (BID) | ORAL | 0 refills | Status: DC

## 2023-06-18 NOTE — Telephone Encounter (Signed)
Patient notified VIA phone. Dm/cma  

## 2023-06-18 NOTE — Telephone Encounter (Signed)
Refill request for  Adderall 30 mg LR  05/20/23, #60, 0 rf LOV 05/20/23 FOV 08/20/23  Please review and advise.  Thanks. Dm/cma

## 2023-06-18 NOTE — Telephone Encounter (Signed)
Prescription Request  06/18/2023  LOV: 05/20/2023  What is the name of the medication or equipment? amphetamine-dextroamphetamine (ADDERALL) 30 ,  Have you contacted your pharmacy to request a refill? Yes   Which pharmacy would you like this sent to?  [facility] [phone] - [place], [place] - [address] AT [facility] [address] [place] [address] Phone: [phone] Fax: [phone]    Patient notified that their request is being sent to the clinical staff for review and that they should receive a response within 2 business days.   Please advise at Mobile There is no such number on file (mobile).

## 2023-06-22 ENCOUNTER — Telehealth: Payer: Self-pay | Admitting: Family Medicine

## 2023-06-22 NOTE — Telephone Encounter (Signed)
PA submitted through cover my meds.  Awaiting response. Dm/cma   Key: [id number]

## 2023-06-22 NOTE — Telephone Encounter (Signed)
Pt is checking the status of the PA for her Ozempic.

## 2023-06-23 NOTE — Telephone Encounter (Signed)
PA approved from 06/22/23 - 06/21/24.  Pharmacy notified and patient. Dm/cma

## 2023-07-19 ENCOUNTER — Other Ambulatory Visit: Payer: Self-pay | Admitting: Family Medicine

## 2023-07-19 ENCOUNTER — Telehealth: Payer: Self-pay | Admitting: Family Medicine

## 2023-07-19 DIAGNOSIS — F909 Attention-deficit hyperactivity disorder, unspecified type: Secondary | ICD-10-CM

## 2023-07-19 MED ORDER — HYDROXYZINE HCL 25 MG PO TABS: 25.0000 mg | ORAL_TABLET | Freq: Four times a day (QID) | ORAL | 5 refills | Status: DC | PRN

## 2023-07-19 MED ORDER — AMPHETAMINE-DEXTROAMPHETAMINE 30 MG PO TABS: 30.0000 mg | ORAL_TABLET | Freq: Two times a day (BID) | ORAL | 0 refills | Status: DC

## 2023-07-19 NOTE — Telephone Encounter (Signed)
Refill request for  Adderall 30 mg LR 06/18/23, #60, 0 rf  Hydroxyzine 25 mg LR  hx provider LOV   05/20/2023 FOV  07/27/23  Fluoxetine 40 mg was sent 04/16/23 #90, 3 rf  (not needed)  Please review and advise.  Thanks Dm/cma

## 2023-07-19 NOTE — Telephone Encounter (Signed)
Refill request  Pt takes last pill today.  FLUoxetine (PROZAC) 40 MG capsule [[id number]]   [id number] - [place], [place] - [address] AT [facility] [address] [place] [address] Phone: [phone]  Fax: [phone]

## 2023-07-19 NOTE — Telephone Encounter (Signed)
Refill request  amphetamine-dextroamphetamine (ADDERALL) 30 MG tablet [[id number]]  FLUoxetine (PROZAC) 40 MG capsule [[id number]]  hydrOXYzine (ATARAX) 25 MG tablet [[id number]]    [id number] - [place], [place] - [address] AT [facility] [address] [place] [address] Phone: [phone]  Fax: [phone]

## 2023-07-20 NOTE — Telephone Encounter (Signed)
Lft VM that RX's have been sent to the pharmacy.  Dm/cma

## 2023-07-20 NOTE — Telephone Encounter (Signed)
Called pharmacy and they already have RX ready for her to pick up.  They did say they spoke to her already as well. Dm/cma

## 2023-07-27 ENCOUNTER — Ambulatory Visit: Payer: Medicaid Other | Admitting: Family Medicine

## 2023-07-27 ENCOUNTER — Telehealth: Payer: Self-pay | Admitting: Family Medicine

## 2023-07-27 NOTE — Telephone Encounter (Signed)
Pt did not show up for appt today. Pls advise.

## 2023-07-27 NOTE — Telephone Encounter (Signed)
1st no show, text sent, letter sent via mail

## 2023-07-30 ENCOUNTER — Other Ambulatory Visit: Payer: Self-pay | Admitting: Family Medicine

## 2023-07-30 DIAGNOSIS — E1169 Type 2 diabetes mellitus with other specified complication: Secondary | ICD-10-CM

## 2023-07-30 DIAGNOSIS — N760 Acute vaginitis: Secondary | ICD-10-CM

## 2023-07-30 NOTE — Telephone Encounter (Signed)
Refill request  metroNIDAZOLE (FLAGYL) 500 MG tablet [[id number]]   [id number] - [place], [place] - [address] AT [facility] [address] [place] [address] Phone: [phone]  Fax: [phone]

## 2023-07-30 NOTE — Telephone Encounter (Signed)
Unable to leave VM to rtn call.   Dm/cma

## 2023-08-02 NOTE — Telephone Encounter (Signed)
Lft VM to rtn call. Dm/cma  

## 2023-08-03 NOTE — Telephone Encounter (Signed)
Spoke to patient and she will wait till her appointment on 08/20/23. Dm/cma

## 2023-08-20 ENCOUNTER — Ambulatory Visit: Payer: Medicaid Other | Admitting: Family Medicine

## 2023-08-20 VITALS — BP 124/80 | HR 82 | Temp 97.7°F | Ht 67.25 in | Wt 177.4 lb

## 2023-08-20 DIAGNOSIS — E785 Hyperlipidemia, unspecified: Secondary | ICD-10-CM | POA: Diagnosis not present

## 2023-08-20 DIAGNOSIS — I10 Essential (primary) hypertension: Secondary | ICD-10-CM | POA: Diagnosis not present

## 2023-08-20 DIAGNOSIS — Z7985 Long-term (current) use of injectable non-insulin antidiabetic drugs: Secondary | ICD-10-CM | POA: Diagnosis not present

## 2023-08-20 DIAGNOSIS — Z7984 Long term (current) use of oral hypoglycemic drugs: Secondary | ICD-10-CM | POA: Diagnosis not present

## 2023-08-20 DIAGNOSIS — J069 Acute upper respiratory infection, unspecified: Secondary | ICD-10-CM | POA: Diagnosis not present

## 2023-08-20 DIAGNOSIS — E1169 Type 2 diabetes mellitus with other specified complication: Secondary | ICD-10-CM | POA: Diagnosis not present

## 2023-08-20 DIAGNOSIS — F419 Anxiety disorder, unspecified: Secondary | ICD-10-CM

## 2023-08-20 DIAGNOSIS — F909 Attention-deficit hyperactivity disorder, unspecified type: Secondary | ICD-10-CM

## 2023-08-20 DIAGNOSIS — J4489 Other specified chronic obstructive pulmonary disease: Secondary | ICD-10-CM

## 2023-08-20 LAB — POCT INFLUENZA A/B
Influenza A, POC: NEGATIVE
Influenza B, POC: NEGATIVE

## 2023-08-20 LAB — HEMOGLOBIN A1C: Hgb A1c MFr Bld: 5.7 % (ref 4.6–6.5)

## 2023-08-20 LAB — GLUCOSE, RANDOM: Glucose, Bld: 74 mg/dL (ref 70–99)

## 2023-08-20 LAB — POC COVID19 BINAXNOW: SARS Coronavirus 2 Ag: NEGATIVE

## 2023-08-20 MED ORDER — FLUOXETINE HCL 40 MG PO CAPS: 40.0000 mg | ORAL_CAPSULE | Freq: Every day | ORAL | 3 refills | Status: AC

## 2023-08-20 MED ORDER — SPIRIVA RESPIMAT 2.5 MCG/ACT IN AERS: 2.0000 | INHALATION_SPRAY | Freq: Every day | RESPIRATORY_TRACT | 11 refills | Status: DC

## 2023-08-20 MED ORDER — AMPHETAMINE-DEXTROAMPHETAMINE 30 MG PO TABS: 30.0000 mg | ORAL_TABLET | Freq: Two times a day (BID) | ORAL | 0 refills | Status: DC

## 2023-08-20 NOTE — Assessment & Plan Note (Signed)
Stable. Continue Adderall 30 mg bid.

## 2023-08-20 NOTE — Assessment & Plan Note (Signed)
 I will check her A1c today. Continue metformin 500 mg 2 tabs bid and Ozempic 1 mg weekly.

## 2023-08-20 NOTE — Assessment & Plan Note (Signed)
Lipids are at goal. Continue atorvastatin 40 mg daily.

## 2023-08-20 NOTE — Assessment & Plan Note (Signed)
 Stable. She is out of Spiriva. She has found the Respimat formulation to work best. I will renew this. She will continue albuterol as needed.

## 2023-08-20 NOTE — Assessment & Plan Note (Addendum)
 Stable. Continue fluoxetine 40 mg daily and hydroxyzine 10 mg daily as needed.
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
## 2023-08-20 NOTE — Assessment & Plan Note (Signed)
 Discussed home care for viral illness, including rest, pushing fluids, and OTC medications as needed for symptom relief. Recommend hot tea with honey for sore throat symptoms. Follow-up if needed for worsening or persistent symptoms.

## 2023-08-20 NOTE — Assessment & Plan Note (Signed)
 Blood pressure is mildly high today. Continue lisinopril 2.5 mg daily.

## 2023-09-16 ENCOUNTER — Other Ambulatory Visit: Payer: Self-pay | Admitting: Family Medicine

## 2023-09-16 DIAGNOSIS — F909 Attention-deficit hyperactivity disorder, unspecified type: Secondary | ICD-10-CM

## 2023-09-16 NOTE — Telephone Encounter (Signed)
Copied from CRM [id number]. Topic: Clinical - Medication Refill >> Sep 16, 2023 11:49 AM Adele Barthel wrote: Most Recent Primary Care Visit:  Provider: Loyola Mast  Department: [facility]  Visit Type: OFFICE VISIT  Date: 08/20/2023  Medication: amphetamine-dextroamphetamine (ADDERALL) 30 MG tablet  Has the patient contacted their pharmacy? Yes (Agent: If no, request that the patient contact the pharmacy for the refill. If patient does not wish to contact the pharmacy document the reason why and proceed with request.) (Agent: If yes, when and what did the pharmacy advise?)  Is this the correct pharmacy for this prescription? Yes If no, delete pharmacy and type the correct one.  This is the patient's preferred pharmacy:  [facility] [phone] - [place], [place] - [address] AT [facility] [address] [place] [address] Phone: [phone] Fax: [phone]   Has the prescription been filled recently? Yes  Is the patient out of the medication? No  Has the patient been seen for an appointment in the last year OR does the patient have an upcoming appointment? Yes  Can we respond through MyChart? Yes  Agent: Please be advised that Rx refills may take up to 3 business days. We ask that you follow-up with your pharmacy.

## 2023-09-16 NOTE — Telephone Encounter (Signed)
Requesting: amphetamine-dextroamphetamine (ADDERALL) 30 MG tablet  Last Visit: 08/20/2023 Next Visit: Visit date not found Last Refill: 08/20/2023  Please Advise

## 2023-09-17 MED ORDER — AMPHETAMINE-DEXTROAMPHETAMINE 30 MG PO TABS: 30.0000 mg | ORAL_TABLET | Freq: Two times a day (BID) | ORAL | 0 refills | Status: DC

## 2023-09-17 NOTE — Telephone Encounter (Signed)
I called and spoke with patient and notified her of approval of Rx refill.

## 2023-09-30 ENCOUNTER — Encounter: Payer: Self-pay | Admitting: Obstetrics and Gynecology

## 2023-09-30 ENCOUNTER — Ambulatory Visit (INDEPENDENT_AMBULATORY_CARE_PROVIDER_SITE_OTHER): Payer: Medicaid Other | Admitting: Obstetrics and Gynecology

## 2023-09-30 ENCOUNTER — Other Ambulatory Visit (HOSPITAL_COMMUNITY)
Admission: RE | Admit: 2023-09-30 | Discharge: 2023-09-30 | Disposition: A | Discharge status: Home / Self Care | Payer: Medicaid Other | Source: Ambulatory Visit | Attending: Obstetrics and Gynecology | Admitting: Obstetrics and Gynecology

## 2023-09-30 VITALS — BP 85/60 | HR 86 | Ht 66.0 in | Wt 170.0 lb

## 2023-09-30 DIAGNOSIS — Z3202 Encounter for pregnancy test, result negative: Secondary | ICD-10-CM

## 2023-09-30 DIAGNOSIS — Z3169 Encounter for other general counseling and advice on procreation: Secondary | ICD-10-CM

## 2023-09-30 DIAGNOSIS — Z01419 Encounter for gynecological examination (general) (routine) without abnormal findings: Secondary | ICD-10-CM | POA: Diagnosis present

## 2023-09-30 DIAGNOSIS — Z1151 Encounter for screening for human papillomavirus (HPV): Secondary | ICD-10-CM | POA: Diagnosis not present

## 2023-09-30 DIAGNOSIS — Z1339 Encounter for screening examination for other mental health and behavioral disorders: Secondary | ICD-10-CM | POA: Diagnosis not present

## 2023-09-30 LAB — POCT URINE PREGNANCY: Preg Test, Ur: NEGATIVE

## 2023-09-30 MED ORDER — METRONIDAZOLE 500 MG PO TABS: 500.0000 mg | ORAL_TABLET | Freq: Two times a day (BID) | ORAL | 1 refills | Status: DC

## 2023-09-30 NOTE — Addendum Note (Signed)
Addended by: Marya Landry D on: 09/30/2023 02:18 PM   Modules accepted: Orders

## 2023-09-30 NOTE — Progress Notes (Addendum)
48 y.o. New GYN presents for AEX/PAP/STD screening.  Pt wants to know if she is in Menopause, c/o cramping and no periods since December.  UPT Negative

## 2023-09-30 NOTE — Progress Notes (Signed)
Subjective:     Yolanda Olson is a 48 y.o. female P2 with 08/05/2023 and BMI 27 who is here for a comprehensive physical exam. The patient reports no problems. She reports a monthly period lasting 4-8 days associated with severe dysmenorrhea. She uses Aygestin to help manage her cramps. She reports no menses in January with negative pregnancy test. She is sexually active seeking a pregnancy. She denies urinary incontinence or issues with bowel movement. Her partner has never had children.   Past Medical History:  Diagnosis Date   Anxiety    COPD (chronic obstructive pulmonary disease) (HCC)    Depression    Diabetes mellitus without complication (HCC)    GERD (gastroesophageal reflux disease)    Hyperlipidemia    Past Surgical History:  Procedure Laterality Date   STRABISMUS SURGERY     Family History  Problem Relation Age of Onset   Heart disease Mother    Hypertension Mother    Hyperlipidemia Mother    Diabetes Mother    Heart disease Father    COPD Father    Cancer Father        lung   Diabetes Father    Heart disease Brother    Stroke Paternal Uncle    Heart disease Paternal Uncle    Social History   Socioeconomic History   Marital status: Single    Spouse name: Not on file   Number of children: 2   Years of education: Not on file   Highest education level: Not on file  Occupational History   Not on file  Tobacco Use   Smoking status: Every Day    Current packs/day: 0.25    Average packs/day: 0.3 packs/day for 17.1 years (4.3 ttl pk-yrs)    Types: Cigarettes    Start date: 2008   Smokeless tobacco: Never  Vaping Use   Vaping status: Every Day  Substance and Sexual Activity   Alcohol use: Yes    Comment: socially   Drug use: Never   Sexual activity: Yes  Other Topics Concern   Not on file  Social History Narrative   Not on file   Social Drivers of Health   Financial Resource Strain: Not on file  Food Insecurity: Not on file  Transportation Needs:  Not on file  Physical Activity: Not on file  Stress: Not on file  Social Connections: Not on file  Intimate Partner Violence: Not on file   Health Maintenance  Topic Date Due   Pneumococcal Vaccine 11-58 Years old (1 of 2 - PCV) Never done   OPHTHALMOLOGY EXAM  Never done   HIV Screening  Never done   Cervical Cancer Screening (HPV/Pap Cotest)  Never done   DTaP/Tdap/Td (4 - Td or Tdap) 09/21/2016   COVID-19 Vaccine (1 - 2024-25 season) Never done   HEMOGLOBIN A1C  02/17/2024   Diabetic kidney evaluation - Urine ACR  03/22/2024   Diabetic kidney evaluation - eGFR measurement  05/04/2024   FOOT EXAM  08/19/2024   Fecal DNA (Cologuard)  10/01/2025   INFLUENZA VACCINE  Completed   Hepatitis C Screening  Completed   HPV VACCINES  Aged Out       Review of Systems Pertinent items noted in HPI and remainder of comprehensive ROS otherwise negative.   Objective:  Blood pressure (!) 85/60, pulse 86, height 5\' 6"  (1.676 m), weight 170 lb (77.1 kg), last menstrual period 08/05/2023.   GENERAL: Well-developed, well-nourished female in no acute distress.  HEENT:  Normocephalic, atraumatic. Sclerae anicteric.  NECK: Supple. Normal thyroid.  LUNGS: Clear to auscultation bilaterally.  HEART: Regular rate and rhythm. BREASTS: Symmetric in size. No palpable masses or lymphadenopathy, skin changes, or nipple drainage. ABDOMEN: Soft, nontender, nondistended. No organomegaly. PELVIC: Normal external female genitalia. Vagina is pink and rugated.  Normal discharge. Normal appearing cervix. Uterus is normal in size. No adnexal mass or tenderness. Chaperone present during the pelvic exam EXTREMITIES: No cyanosis, clubbing, or edema, 2+ distal pulses.     Assessment:    Healthy female exam.      Plan:    Pap smear collected Screening mammogram ordered STI screening per patient request Patient scheduled for colonoscopy in March Patient will be contacted with abnormal results Patient desires  to conceive- labs ordered Patient also referred to [facility] See After Visit Summary for Counseling Recommendations

## 2023-10-01 LAB — CERVICOVAGINAL ANCILLARY ONLY
Chlamydia: NEGATIVE
Comment: NEGATIVE
Comment: NORMAL
Neisseria Gonorrhea: NEGATIVE

## 2023-10-05 LAB — CYTOLOGY - PAP
Comment: NEGATIVE
Diagnosis: UNDETERMINED — AB
High risk HPV: NEGATIVE

## 2023-10-06 LAB — TSH+PRL+FSH+TESTT+LH+DHEA S...
17-Hydroxyprogesterone: 250 ng/dL
Androstenedione: 82 ng/dL (ref 41–262)
DHEA-SO4: 27 ug/dL — ABNORMAL LOW (ref 41.2–243.7)
FSH: 2 m[IU]/mL
LH: 1.4 m[IU]/mL
Prolactin: 13.4 ng/mL (ref 4.8–33.4)
TSH: 1.54 u[IU]/mL (ref 0.450–4.500)
Testosterone, Free: 0.4 pg/mL (ref 0.0–4.2)
Testosterone: 9 ng/dL (ref 4–50)

## 2023-10-06 LAB — ANTI MULLERIAN HORMONE: ANTI-MULLERIAN HORMONE (AMH): 0.225 ng/mL

## 2023-10-06 LAB — HEPATITIS C ANTIBODY: Hep C Virus Ab: NONREACTIVE

## 2023-10-06 LAB — HIV ANTIBODY (ROUTINE TESTING W REFLEX): HIV Screen 4th Generation wRfx: NONREACTIVE

## 2023-10-06 LAB — RPR: RPR Ser Ql: NONREACTIVE

## 2023-10-06 LAB — HEPATITIS B SURFACE ANTIGEN: Hepatitis B Surface Ag: NEGATIVE

## 2023-10-08 ENCOUNTER — Telehealth: Payer: Self-pay

## 2023-10-08 NOTE — Telephone Encounter (Signed)
 S/w pt an advised of results and recommendations for following up for pap and with fertility specialist

## 2023-10-14 ENCOUNTER — Other Ambulatory Visit: Payer: Self-pay | Admitting: Family Medicine

## 2023-10-14 DIAGNOSIS — F909 Attention-deficit hyperactivity disorder, unspecified type: Secondary | ICD-10-CM

## 2023-10-14 NOTE — Telephone Encounter (Signed)
 Refill request for  Adderall 30 mg LR  09/17/23, #60, 0 rf LOV  09/17/23, # 60, 0 rf FOV   none scheduled.   Zofran 4mg  LR  hx provider  Please review and advise.  Thanks. Dm/cma

## 2023-10-14 NOTE — Telephone Encounter (Unsigned)
 Copied from CRM [id number]. Topic: Clinical - Medication Refill >> Oct 14, 2023  1:51 PM Adaysia C wrote: Most Recent Primary Care Visit:  Provider: Loyola Mast  Department: [facility]  Visit Type: OFFICE VISIT  Date: 08/20/2023  Medication: ondansetron (ZOFRAN-ODT) 4 MG disintegrating tablet amphetamine-dextroamphetamine (ADDERALL) 30 MG tablet  Has the patient contacted their pharmacy? No, patient initiated RX refills through the provider. (Agent: If no, request that the patient contact the pharmacy for the refill. If patient does not wish to contact the pharmacy document the reason why and proceed with request.) (Agent: If yes, when and what did the pharmacy advise?)  Is this the correct pharmacy for this prescription? Yes If no, delete pharmacy and type the correct one.  This is the patient's preferred pharmacy:  [facility] [phone] - [place], [place] - [address] AT [facility] [address] [place] [address] Phone: [phone] Fax: [phone]   Has the prescription been filled recently? Yes  Is the patient out of the medication? Yes  Has the patient been seen for an appointment in the last year OR does the patient have an upcoming appointment? Yes  Can we respond through MyChart? No  Agent: Please be advised that Rx refills may take up to 3 business days. We ask that you follow-up with your pharmacy.

## 2023-10-15 MED ORDER — AMPHETAMINE-DEXTROAMPHETAMINE 30 MG PO TABS: 30.0000 mg | ORAL_TABLET | Freq: Two times a day (BID) | ORAL | 0 refills | Status: DC

## 2023-10-15 MED ORDER — ONDANSETRON 4 MG PO TBDP: 4.0000 mg | ORAL_TABLET | Freq: Four times a day (QID) | ORAL | 2 refills | Status: DC

## 2023-11-11 ENCOUNTER — Other Ambulatory Visit: Payer: Self-pay | Admitting: Family Medicine

## 2023-11-11 DIAGNOSIS — F909 Attention-deficit hyperactivity disorder, unspecified type: Secondary | ICD-10-CM

## 2023-11-11 NOTE — Telephone Encounter (Signed)
 Copied from CRM [id number]. Topic: Clinical - Medication Refill >> Nov 11, 2023  4:52 PM Eunice Blase wrote: Most Recent Primary Care Visit:  Provider: Loyola Mast  Department: [facility]  Visit Type: OFFICE VISIT  Date: 08/20/2023  Medication: amphetamine-dextroamphetamine (ADDERALL) 30 MG tablet  Has the patient contacted their pharmacy? Yes (Agent: If no, request that the patient contact the pharmacy for the refill. If patient does not wish to contact the pharmacy document the reason why and proceed with request.) (Agent: If yes, when and what did the pharmacy advise?)Pharmacy need PCP approval  Is this the correct pharmacy for this prescription? Yes If no, delete pharmacy and type the correct one.  This is the patient's preferred pharmacy:  [facility] [phone] - [place], [place] - [address] AT [facility] [address] [place] [address] Phone: [phone] Fax: [phone]   Has the prescription been filled recently? Yes  Is the patient out of the medication? Yes  Has the patient been seen for an appointment in the last year OR does the patient have an upcoming appointment? Yes  Can we respond through MyChart? Yes  Agent: Please be advised that Rx refills may take up to 3 business days. We ask that you follow-up with your pharmacy.

## 2023-11-12 MED ORDER — AMPHETAMINE-DEXTROAMPHETAMINE 30 MG PO TABS: 30.0000 mg | ORAL_TABLET | Freq: Two times a day (BID) | ORAL | 0 refills | Status: DC

## 2023-11-12 NOTE — Telephone Encounter (Signed)
 Requesting: amphetamine-dextroamphetamine (ADDERALL) 30 MG tablet  Last Visit: 08/20/2023 Next Visit: Visit date not found Last Refill: 10/15/2023  Please Advise

## 2023-12-05 ENCOUNTER — Other Ambulatory Visit: Payer: Self-pay | Admitting: Obstetrics and Gynecology

## 2023-12-06 ENCOUNTER — Other Ambulatory Visit: Payer: Self-pay

## 2023-12-06 DIAGNOSIS — N920 Excessive and frequent menstruation with regular cycle: Secondary | ICD-10-CM

## 2023-12-06 MED ORDER — METRONIDAZOLE 500 MG PO TABS: 500.0000 mg | ORAL_TABLET | Freq: Two times a day (BID) | ORAL | 0 refills | Status: DC

## 2023-12-06 MED ORDER — NORETHINDRONE ACETATE 5 MG PO TABS: 5.0000 mg | ORAL_TABLET | Freq: Every day | ORAL | 1 refills | Status: AC

## 2023-12-13 ENCOUNTER — Encounter: Payer: Self-pay | Admitting: Nurse Practitioner

## 2023-12-13 ENCOUNTER — Ambulatory Visit: Admitting: Nurse Practitioner

## 2023-12-13 VITALS — BP 108/72 | HR 88 | Temp 97.7°F | Ht 64.0 in | Wt 169.0 lb

## 2023-12-13 DIAGNOSIS — M5442 Lumbago with sciatica, left side: Secondary | ICD-10-CM

## 2023-12-13 DIAGNOSIS — G8929 Other chronic pain: Secondary | ICD-10-CM

## 2023-12-13 DIAGNOSIS — F909 Attention-deficit hyperactivity disorder, unspecified type: Secondary | ICD-10-CM

## 2023-12-13 DIAGNOSIS — M170 Bilateral primary osteoarthritis of knee: Secondary | ICD-10-CM | POA: Diagnosis not present

## 2023-12-13 DIAGNOSIS — M1612 Unilateral primary osteoarthritis, left hip: Secondary | ICD-10-CM

## 2023-12-13 MED ORDER — AMPHETAMINE-DEXTROAMPHETAMINE 30 MG PO TABS: 30.0000 mg | ORAL_TABLET | Freq: Two times a day (BID) | ORAL | 0 refills | Status: DC

## 2023-12-13 NOTE — Assessment & Plan Note (Signed)
 Needs med refill PMP database reviewed: Adderall 30mg  BID last filled 11/12/23, 10/15/23, 09/17/2023.  Refill sent for 30days Advised to schedule f/up appointment with pcp in 33month or additional med refill

## 2023-12-13 NOTE — Patient Instructions (Signed)
 Schedule appointment with pcp for additional med refills

## 2023-12-13 NOTE — Assessment & Plan Note (Signed)
 Current use of oxycodone , gabapentin  and flexeril  Request to transfer care from [facility] to [facility] Referral entered

## 2023-12-13 NOTE — Assessment & Plan Note (Signed)
 Current use of oxycodone , gabapentin  and flexeril  Request to transfer care from [facility] to [facility] pain clinic Referral entered

## 2023-12-13 NOTE — Progress Notes (Signed)
 Acute Office Visit  Subjective:    Patient ID: Yolanda Olson, female    DOB: [birth year], 48 y.o.   MRN: [id number]  Chief Complaint  Patient presents with   Pain    Left hip and knee pain worse when ambulating and sometimes sitting    HPI Chronic low back pain with left-sided sciatica Current use of oxycodone , gabapentin  and flexeril  Request to transfer care from [facility] Pain clinic to [facility] health pain clinic Referral entered  Osteoarthritis of left hip Current use of oxycodone , gabapentin  and flexeril  Request to transfer care from [facility] Pain clinic to [facility] health pain clinic Referral entered  Bilateral primary osteoarthritis of knee Current use of oxycodone , gabapentin  and flexeril  Request to transfer care from [facility] Pain clinic to [facility] health pain clinic Referral entered  Adult ADHD Needs med refill PMP database reviewed: Adderall 30mg  BID last filled 11/12/23, 10/15/23, 09/17/2023.  Refill sent for 30days Advised to schedule f/up appointment with pcp in 80month or additional med refill  Outpatient Medications Prior to Visit  Medication Sig   albuterol  (VENTOLIN  HFA) 108 (90 Base) MCG/ACT inhaler Inhale 2 puffs into the lungs every 6 (six) hours as needed for wheezing or shortness of breath.   atorvastatin  (LIPITOR) 40 MG tablet Take 1 tablet (40 mg total) by mouth daily.   blood glucose meter kit and supplies 1 each by Other route as directed. Dispense based on patient and insurance preference. Use up to four times daily as directed. (FOR ICD-10 E10.9, E11.9).   budesonide -formoterol  (SYMBICORT ) 80-4.5 MCG/ACT inhaler Inhale 2 puffs into the lungs 2 (two) times daily.   FLUoxetine  (PROZAC ) 40 MG capsule Take 1 capsule (40 mg total) by mouth daily.   gabapentin  (NEURONTIN ) 300 MG capsule Take 1 capsule (300 mg total) by mouth 3 (three) times daily.   glucose blood (FREESTYLE LITE) test strip 1 each by Other route as needed for other. Use as instructed   hydrOXYzine   (ATARAX ) 25 MG tablet Take 1 tablet (25 mg total) by mouth every 6 (six) hours as needed for anxiety.   lisinopril  (ZESTRIL ) 2.5 MG tablet Take 1 tablet (2.5 mg total) by mouth daily.   metFORMIN  (GLUCOPHAGE -XR) 500 MG 24 hr tablet Take 1 tablet (500 mg total) by mouth 2 (two) times daily with a meal.   metroNIDAZOLE  (FLAGYL ) 500 MG tablet Take 1 tablet (500 mg total) by mouth 2 (two) times daily.   naloxone (NARCAN) nasal spray 4 mg/0.1 mL Place 1 spray into the nose once.   norethindrone  (AYGESTIN ) 5 MG tablet Take 1 tablet (5 mg total) by mouth daily.   omeprazole  (PRILOSEC) 40 MG capsule Take 1 capsule (40 mg total) by mouth daily.   ondansetron  (ZOFRAN -ODT) 4 MG disintegrating tablet Take 1 tablet (4 mg total) by mouth every 6 (six) hours.   Oxycodone  HCl 10 MG TABS Take 1 tablet (10 mg total) by mouth in the morning, at noon, and at bedtime.   Semaglutide , 1 MG/DOSE, (OZEMPIC , 1 MG/DOSE,) 4 MG/3ML SOPN Inject 1 mg into the skin once a week.   Tiotropium Bromide  Monohydrate (SPIRIVA  RESPIMAT) 2.5 MCG/ACT AERS Inhale 2 puffs into the lungs daily.   zolpidem  (AMBIEN ) 10 MG tablet Take 1 tablet (10 mg total) by mouth at bedtime as needed for sleep.   [DISCONTINUED] amphetamine -dextroamphetamine  (ADDERALL) 30 MG tablet Take 1 tablet by mouth 2 (two) times daily.   [DISCONTINUED] cyclobenzaprine  (FLEXERIL ) 10 MG tablet Take 1 tablet (10 mg total) by mouth daily as needed  for muscle spasms.   [DISCONTINUED] meloxicam (MOBIC) 15 MG tablet Take 15 mg by mouth daily.   No facility-administered medications prior to visit.    Reviewed past medical and social history.  Review of Systems Per HPI     Objective:    Physical Exam Vitals and nursing note reviewed.  Musculoskeletal:     Left hip: Tenderness present. No crepitus. Decreased range of motion. Decreased strength.     Left upper leg: Normal.     Left knee: Crepitus present. No swelling, effusion or erythema. Decreased range of motion.  Tenderness present over the medial joint line and lateral joint line. No patellar tendon tenderness.  Neurological:     Mental Status: She is alert.    BP 108/72 (BP Location: Left Arm, Patient Position: Sitting, Cuff Size: Normal)   Pulse 88   Temp 97.7 F (36.5 C) (Temporal)   Ht 5\' 4"  (1.626 m)   Wt 169 lb (76.7 kg)   LMP 12/12/2023   SpO2 98%   BMI 29.01 kg/m    No results found for any visits on 12/13/23.     Assessment & Plan:   Problem List Items Addressed This Visit     Adult ADHD   Needs med refill PMP database reviewed: Adderall 30mg  BID last filled 11/12/23, 10/15/23, 09/17/2023.  Refill sent for 30days Advised to schedule f/up appointment with pcp in 56month or additional med refill      Relevant Medications   amphetamine -dextroamphetamine  (ADDERALL) 30 MG tablet   Bilateral primary osteoarthritis of knee   Current use of oxycodone , gabapentin  and flexeril  Request to transfer care from [facility] Pain clinic to [facility] health pain clinic Referral entered      Relevant Orders   Ambulatory referral to Pain Clinic   Chronic low back pain with left-sided sciatica - Primary   Current use of oxycodone , gabapentin  and flexeril  Request to transfer care from [facility] Pain clinic to [facility] health pain clinic Referral entered      Relevant Medications   amphetamine -dextroamphetamine  (ADDERALL) 30 MG tablet   Other Relevant Orders   Ambulatory referral to Pain Clinic   Osteoarthritis of left hip   Current use of oxycodone , gabapentin  and flexeril  Request to transfer care from [facility] Pain clinic to [facility] health pain clinic Referral entered      Relevant Orders   Ambulatory referral to Pain Clinic   Meds ordered this encounter  Medications   amphetamine -dextroamphetamine  (ADDERALL) 30 MG tablet    Sig: Take 1 tablet by mouth 2 (two) times daily. Additional refills from pcp    Dispense:  60 tablet    Refill:  0    Supervising Provider:   Tonna Frederic  [[id number]]   Return in about 4 weeks (around 01/10/2024) for ADHD with Dr. Therese Flash.    Kathrene Parents, NP

## 2023-12-15 ENCOUNTER — Telehealth: Payer: Self-pay | Admitting: Family Medicine

## 2023-12-15 NOTE — Telephone Encounter (Signed)
 Copied from CRM [id number]. Topic: Referral - Question >> Dec 15, 2023  2:14 PM [place] E wrote: Reason for CRM: Patient called in and would like her pain management referral to go to [facility] instead of [facility]. Callback number for patient is [phone].

## 2023-12-16 NOTE — Telephone Encounter (Signed)
 done

## 2023-12-30 ENCOUNTER — Other Ambulatory Visit: Payer: Self-pay | Admitting: Obstetrics and Gynecology

## 2023-12-31 ENCOUNTER — Encounter: Payer: Self-pay | Admitting: Physical Medicine and Rehabilitation

## 2024-01-05 ENCOUNTER — Other Ambulatory Visit: Payer: Self-pay | Admitting: Obstetrics and Gynecology

## 2024-01-12 ENCOUNTER — Other Ambulatory Visit: Payer: Self-pay | Admitting: Family Medicine

## 2024-01-12 ENCOUNTER — Telehealth: Payer: Self-pay

## 2024-01-12 DIAGNOSIS — F909 Attention-deficit hyperactivity disorder, unspecified type: Secondary | ICD-10-CM

## 2024-01-12 NOTE — Telephone Encounter (Unsigned)
 Copied from CRM [id number]. Topic: Clinical - Medication Refill >> Jan 12, 2024  9:19 AM [place] M wrote: Medication: amphetamine -dextroamphetamine  (ADDERALL) 30 MG tablet  Has the patient contacted their pharmacy? Yes (Agent: If no, request that the patient contact the pharmacy for the refill. If patient does not wish to contact the pharmacy document the reason why and proceed with request.) (Agent: If yes, when and what did the pharmacy advise?)  This is the patient's preferred pharmacy:  [facility] [id number] - [place], [place] - [address] AT [facility] [address] [place] [address] Phone: [phone] Fax: [phone]   Is this the correct pharmacy for this prescription? Yes If no, delete pharmacy and type the correct one.   Has the prescription been filled recently? Yes  Is the patient out of the medication? Yes  Has the patient been seen for an appointment in the last year OR does the patient have an upcoming appointment? Yes  Can we respond through MyChart? Yes  Agent: Please be advised that Rx refills may take up to 3 business days. We ask that you follow-up with your pharmacy.

## 2024-01-12 NOTE — Telephone Encounter (Signed)
 Copied from CRM [id number]. Topic: Clinical - Medication Refill >> Jan 12, 2024  5:45 PM Magdalene School wrote: Patient called to check status of refill, I informed her that it may take up to 3 business days for refill to process.

## 2024-01-13 ENCOUNTER — Other Ambulatory Visit: Payer: Self-pay | Admitting: Family Medicine

## 2024-01-13 MED ORDER — AMPHETAMINE-DEXTROAMPHETAMINE 30 MG PO TABS: 30.0000 mg | ORAL_TABLET | Freq: Two times a day (BID) | ORAL | 0 refills | Status: DC

## 2024-01-13 NOTE — Telephone Encounter (Unsigned)
 Copied from CRM [id number]. Topic: Clinical - Medication Refill >> Jan 13, 2024 11:54 AM Yolanda Olson wrote: Medication:  amphetamine -dextroamphetamine  (ADDERALL) 30 MG tablet  Patient stated that she is completely out of medication. Advised her to never wait til she's completely out to call in a refill and advised her  Rx refills may take up to 3 business days. We ask that you follow-up with your pharmacy.

## 2024-01-19 ENCOUNTER — Encounter: Payer: Self-pay | Admitting: Family Medicine

## 2024-01-19 ENCOUNTER — Ambulatory Visit: Admitting: Family Medicine

## 2024-01-19 ENCOUNTER — Ambulatory Visit: Payer: Self-pay | Admitting: Family Medicine

## 2024-01-19 ENCOUNTER — Telehealth: Payer: Self-pay

## 2024-01-19 VITALS — BP 118/70 | HR 87 | Temp 97.5°F | Ht 64.0 in | Wt 170.0 lb

## 2024-01-19 DIAGNOSIS — M170 Bilateral primary osteoarthritis of knee: Secondary | ICD-10-CM | POA: Diagnosis not present

## 2024-01-19 DIAGNOSIS — E1169 Type 2 diabetes mellitus with other specified complication: Secondary | ICD-10-CM | POA: Diagnosis not present

## 2024-01-19 DIAGNOSIS — J4489 Other specified chronic obstructive pulmonary disease: Secondary | ICD-10-CM | POA: Diagnosis not present

## 2024-01-19 DIAGNOSIS — Z7984 Long term (current) use of oral hypoglycemic drugs: Secondary | ICD-10-CM

## 2024-01-19 DIAGNOSIS — I10 Essential (primary) hypertension: Secondary | ICD-10-CM | POA: Diagnosis not present

## 2024-01-19 DIAGNOSIS — Z7985 Long-term (current) use of injectable non-insulin antidiabetic drugs: Secondary | ICD-10-CM

## 2024-01-19 DIAGNOSIS — F909 Attention-deficit hyperactivity disorder, unspecified type: Secondary | ICD-10-CM

## 2024-01-19 DIAGNOSIS — E785 Hyperlipidemia, unspecified: Secondary | ICD-10-CM

## 2024-01-19 DIAGNOSIS — B9689 Other specified bacterial agents as the cause of diseases classified elsewhere: Secondary | ICD-10-CM

## 2024-01-19 LAB — GLUCOSE, RANDOM: Glucose, Bld: 102 mg/dL — ABNORMAL HIGH (ref 70–99)

## 2024-01-19 LAB — HEMOGLOBIN A1C: Hgb A1c MFr Bld: 5.7 % (ref 4.6–6.5)

## 2024-01-19 MED ORDER — TRIAMCINOLONE ACETONIDE 40 MG/ML IJ SUSP: 40.0000 mg | Freq: Once | INTRAMUSCULAR | Status: DC

## 2024-01-19 MED ORDER — LIDOCAINE HCL 2 % IJ SOLN
1.0000 mL | Freq: Once | INTRAMUSCULAR | Status: AC
  Administered 2024-01-19: 20 mg

## 2024-01-19 MED ORDER — ATROVENT HFA 17 MCG/ACT IN AERS: 2.0000 | INHALATION_SPRAY | Freq: Four times a day (QID) | RESPIRATORY_TRACT | 12 refills | Status: DC | PRN

## 2024-01-19 MED ORDER — TRIAMCINOLONE ACETONIDE 40 MG/ML IJ SUSP
40.0000 mg | Freq: Once | INTRAMUSCULAR | Status: AC
  Administered 2024-01-19: 40 mg via INTRA_ARTICULAR

## 2024-01-19 MED ORDER — METRONIDAZOLE 500 MG PO TABS: 500.0000 mg | ORAL_TABLET | Freq: Two times a day (BID) | ORAL | 0 refills | Status: DC
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
## 2024-01-19 NOTE — Assessment & Plan Note (Signed)
 I will check her A1c today. Continue metformin 500 mg 2 tabs bid and Ozempic 1 mg weekly.

## 2024-01-19 NOTE — Assessment & Plan Note (Addendum)
 Injected left knee as noted above. Plan to follow-up as scheduled with physiatry. I encouraged her to follow through with orthopedics as previously referred.

## 2024-01-19 NOTE — Assessment & Plan Note (Signed)
 I will plan to switch her from Spiriva  to Atrovent. Continue Symbicort  and albuterol  PRN

## 2024-01-19 NOTE — Assessment & Plan Note (Signed)
Stable. Continue Adderall 30 mg bid.

## 2024-01-19 NOTE — Assessment & Plan Note (Signed)
 Blood pressure is at goal today. Continue lisinopril  2.5 mg daily.

## 2024-01-19 NOTE — Telephone Encounter (Signed)
 Received request for refill of metronidazole  for recurrent BV.  Graig Lawyer, MD

## 2024-01-19 NOTE — Assessment & Plan Note (Signed)
Lipids are at goal. Continue atorvastatin 40 mg daily.

## 2024-01-21 ENCOUNTER — Encounter (HOSPITAL_BASED_OUTPATIENT_CLINIC_OR_DEPARTMENT_OTHER): Payer: Self-pay | Admitting: Emergency Medicine

## 2024-01-21 ENCOUNTER — Emergency Department (HOSPITAL_BASED_OUTPATIENT_CLINIC_OR_DEPARTMENT_OTHER): Admitting: Radiology

## 2024-01-21 ENCOUNTER — Emergency Department (HOSPITAL_BASED_OUTPATIENT_CLINIC_OR_DEPARTMENT_OTHER)
Admission: EM | Admit: 2024-01-21 | Discharge: 2024-01-21 | Disposition: A | Discharge status: Home / Self Care | Attending: Emergency Medicine | Admitting: Emergency Medicine

## 2024-01-21 ENCOUNTER — Other Ambulatory Visit: Payer: Self-pay

## 2024-01-21 DIAGNOSIS — M545 Low back pain, unspecified: Secondary | ICD-10-CM | POA: Diagnosis present

## 2024-01-21 DIAGNOSIS — E119 Type 2 diabetes mellitus without complications: Secondary | ICD-10-CM | POA: Diagnosis not present

## 2024-01-21 DIAGNOSIS — Z7984 Long term (current) use of oral hypoglycemic drugs: Secondary | ICD-10-CM | POA: Diagnosis not present

## 2024-01-21 DIAGNOSIS — M25552 Pain in left hip: Secondary | ICD-10-CM | POA: Diagnosis not present

## 2024-01-21 DIAGNOSIS — Y92410 Unspecified street and highway as the place of occurrence of the external cause: Secondary | ICD-10-CM | POA: Diagnosis not present

## 2024-01-21 LAB — PREGNANCY, URINE: Preg Test, Ur: NEGATIVE

## 2024-01-21 MED ORDER — CYCLOBENZAPRINE HCL 10 MG PO TABS
10.0000 mg | ORAL_TABLET | Freq: Two times a day (BID) | ORAL | 0 refills | Status: AC | PRN
Start: 2024-01-21 — End: ?

## 2024-01-21 MED ORDER — CELECOXIB 200 MG PO CAPS: 200.0000 mg | ORAL_CAPSULE | Freq: Two times a day (BID) | ORAL | 0 refills | Status: AC | PRN

## 2024-01-21 MED ORDER — KETOROLAC TROMETHAMINE 30 MG/ML IJ SOLN
30.0000 mg | Freq: Once | INTRAMUSCULAR | Status: AC
  Administered 2024-01-21: 30 mg via INTRAMUSCULAR
  Filled 2024-01-21: qty 1
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
## 2024-01-21 NOTE — Discharge Instructions (Addendum)
 Your workup today was reassuring.  Imaging of your low back, left hip were without fracture or dislocation.  Was needed with anti-inflammatory as well as muscle laxer to use to treat your symptoms.  Muscle relaxer can cause drowsiness so please do not drive or perform any high-risk activity until you realize this medications effects on you.  Recommend follow-up with your primary care for reassessment.  Please do not hesitate to return to the emergency department if the worrisome signs and symptoms we discussed become apparent.
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
## 2024-01-21 NOTE — ED Notes (Signed)
 RN reviewed discharge instructions with pt. Pt verbalized understanding and had no further questions. VSS upon discharge.

## 2024-02-02 ENCOUNTER — Other Ambulatory Visit: Payer: Self-pay | Admitting: Family Medicine

## 2024-02-02 DIAGNOSIS — F909 Attention-deficit hyperactivity disorder, unspecified type: Secondary | ICD-10-CM

## 2024-02-02 MED ORDER — AMPHETAMINE-DEXTROAMPHETAMINE 30 MG PO TABS
30.0000 mg | ORAL_TABLET | Freq: Two times a day (BID) | ORAL | 0 refills | Status: DC
Start: 2024-02-02 — End: 2024-03-02

## 2024-02-02 MED ORDER — HYDROXYZINE HCL 25 MG PO TABS: 25.0000 mg | ORAL_TABLET | Freq: Four times a day (QID) | ORAL | 5 refills | Status: DC | PRN

## 2024-02-02 NOTE — Telephone Encounter (Signed)
 Refill request for  Hydroxizine 25 mg  LR 07/19/23, #25, 5 rf  Adderall 30 mg LR 01/13/24, #30, 0 rf LOV  01/19/24 FOV  04/24/24  Please review and advise.  Thanks. Dm/cma

## 2024-02-02 NOTE — Telephone Encounter (Signed)
 Copied from CRM [id number]. Topic: Clinical - Medication Refill >> Feb 02, 2024  1:50 PM Alexis C wrote: Medication: amphetamine -dextroamphetamine  (ADDERALL) 30 MG tablet hydrOXYzine  (ATARAX ) 25 MG tablet  Has the patient contacted their pharmacy? Yes (Agent: If no, request that the patient contact the pharmacy for the refill. If patient does not wish to contact the pharmacy document the reason why and proceed with request.) (Agent: If yes, when and what did the pharmacy advise?)  This is the patient's preferred pharmacy:  [facility] [phone] - [place], [place] - [address] AT [facility] [address] [place] [address] Phone: [phone] Fax: [phone]    Is this the correct pharmacy for this prescription? Yes If no, delete pharmacy and type the correct one.   Has the prescription been filled recently? No  Is the patient out of the medication? Yes  Has the patient been seen for an appointment in the last year OR does the patient have an upcoming appointment? Yes  Can we respond through MyChart? Yes  Agent: Please be advised that Rx refills may take up to 3 business days. We ask that you follow-up with your pharmacy.

## 2024-02-22 ENCOUNTER — Ambulatory Visit: Admitting: Family Medicine

## 2024-02-23 ENCOUNTER — Encounter: Admitting: Physical Medicine and Rehabilitation

## 2024-03-02 ENCOUNTER — Other Ambulatory Visit: Payer: Self-pay | Admitting: Family Medicine

## 2024-03-02 DIAGNOSIS — F909 Attention-deficit hyperactivity disorder, unspecified type: Secondary | ICD-10-CM

## 2024-03-02 MED ORDER — AMPHETAMINE-DEXTROAMPHETAMINE 30 MG PO TABS
30.0000 mg | ORAL_TABLET | Freq: Two times a day (BID) | ORAL | 0 refills | Status: DC
Start: 2024-03-02 — End: 2024-03-30

## 2024-03-02 NOTE — Telephone Encounter (Signed)
 Requesting: amphetamine -dextroamphetamine  (ADDERALL) 30 MG tablet  Last Visit: 01/19/2024 Next Visit: 03/09/2024 Last Refill: 02/02/2024  Please Advise

## 2024-03-02 NOTE — Telephone Encounter (Signed)
 Patient notified that Rx refill request approved.

## 2024-03-02 NOTE — Telephone Encounter (Signed)
 Copied from CRM [id number]. Topic: Clinical - Medication Refill >> Mar 02, 2024  3:33 PM [place] J wrote: Medication: amphetamine -dextroamphetamine  (ADDERALL) 30 MG tablet  Has the patient contacted their pharmacy? No (Agent: If no, request that the patient contact the pharmacy for the refill. If patient does not wish to contact the pharmacy document the reason why and proceed with request.) (Agent: If yes, when and what did the pharmacy advise?)  This is the patient's preferred pharmacy:   [id number] - [place], [place] - [address] AT [facility] OF [address] [place] [address] Phone: [phone] Fax: [phone]  Is this the correct pharmacy for this prescription? Yes If no, delete pharmacy and type the correct one.   Has the prescription been filled recently? No  Is the patient out of the medication? No  Has the patient been seen for an appointment in the last year OR does the patient have an upcoming appointment? Yes  Can we respond through MyChart? No  Agent: Please be advised that Rx refills may take up to 3 business days. We ask that you follow-up with your pharmacy.

## 2024-03-09 ENCOUNTER — Ambulatory Visit: Admitting: Family Medicine

## 2024-03-12 ENCOUNTER — Other Ambulatory Visit: Payer: Self-pay

## 2024-03-12 ENCOUNTER — Encounter (HOSPITAL_COMMUNITY): Payer: Self-pay | Admitting: *Deleted

## 2024-03-12 ENCOUNTER — Emergency Department (HOSPITAL_COMMUNITY)
Admission: EM | Admit: 2024-03-12 | Discharge: 2024-03-12 | Disposition: A | Discharge status: Home / Self Care | Attending: Emergency Medicine | Admitting: Emergency Medicine

## 2024-03-12 ENCOUNTER — Emergency Department (HOSPITAL_COMMUNITY)

## 2024-03-12 DIAGNOSIS — H05222 Edema of left orbit: Secondary | ICD-10-CM | POA: Diagnosis not present

## 2024-03-12 DIAGNOSIS — H538 Other visual disturbances: Secondary | ICD-10-CM | POA: Diagnosis present

## 2024-03-12 DIAGNOSIS — S0990XA Unspecified injury of head, initial encounter: Secondary | ICD-10-CM

## 2024-03-12 DIAGNOSIS — Z23 Encounter for immunization: Secondary | ICD-10-CM | POA: Diagnosis not present

## 2024-03-12 DIAGNOSIS — S01112A Laceration without foreign body of left eyelid and periocular area, initial encounter: Secondary | ICD-10-CM | POA: Insufficient documentation

## 2024-03-12 DIAGNOSIS — R600 Localized edema: Secondary | ICD-10-CM

## 2024-03-12 MED ORDER — TETANUS-DIPHTH-ACELL PERTUSSIS 5-2.5-18.5 LF-MCG/0.5 IM SUSY
0.5000 mL | PREFILLED_SYRINGE | Freq: Once | INTRAMUSCULAR | Status: AC
  Administered 2024-03-12: 0.5 mL via INTRAMUSCULAR
  Filled 2024-03-12: qty 0.5

## 2024-03-12 MED ORDER — FLUORESCEIN SODIUM 1 MG OP STRP
1.0000 | ORAL_STRIP | Freq: Once | OPHTHALMIC | Status: AC
  Administered 2024-03-12: 1 via OPHTHALMIC
  Filled 2024-03-12: qty 1

## 2024-03-12 MED ORDER — ACETAMINOPHEN 500 MG PO TABS: 1000.0000 mg | ORAL_TABLET | Freq: Four times a day (QID) | ORAL | Status: DC | PRN

## 2024-03-12 MED ORDER — BACITRACIN ZINC 500 UNIT/GM EX OINT
TOPICAL_OINTMENT | CUTANEOUS | Status: AC
  Administered 2024-03-12: 1
  Filled 2024-03-12: qty 0.9

## 2024-03-12 MED ORDER — TETRACAINE HCL 0.5 % OP SOLN
2.0000 [drp] | Freq: Once | OPHTHALMIC | Status: AC
  Administered 2024-03-12: 2 [drp] via OPHTHALMIC
  Filled 2024-03-12: qty 4
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
## 2024-03-12 NOTE — Discharge Instructions (Addendum)
 Your exam was reassuring today.  You do not have any evidence of abrasions to the surface of your eye or other eye injury.  Your CT did not show any evidence of a skull fracture.  You do have some swelling around her eye.  Please apply some ice to the area until with pain and swelling.  You had a small cut of your left eyebrow.  Please keep this area clean with soap and water.  You may apply an antibiotic ointment such as bacitracin  ointment or Neosporin to help with healing.  Your tetanus was updated here today  You may take up to 1000mg  of tylenol  every 6 hours as needed for pain.  Do not take more then 4g per day. You were given your first dose here today.  You may use up to 600mg  ibuprofen every 6 hours as needed for pain.  Do not exceed 2.4g of ibuprofen per day.  Please follow-up with your PCP if you have further concerns.  Please return to the ER for any sudden vision changes, severe headache not controlled with Tylenol  or ibuprofen, any other new or concerning symptoms

## 2024-03-12 NOTE — ED Notes (Signed)
 Small cut above left eyebrow noted.  Bleeding controlled.

## 2024-03-12 NOTE — ED Notes (Signed)
 Patient transported to CT

## 2024-03-16 ENCOUNTER — Telehealth: Payer: Self-pay

## 2024-03-16 NOTE — Transitions of Care (Post Inpatient/ED Visit) (Signed)
 03/16/2024  Name: Yolanda Olson MRN: [id number] DOB: 1975-09-06  Today's TOC FU Call Status: Today's TOC FU Call Status:: Successful TOC FU Call Completed TOC FU Call Complete Date: 03/16/24 Patient's Name and Date of Birth confirmed.  Transition Care Management Follow-up Telephone Call Date of Discharge: 03/12/24 Discharge Facility: [facility]) Type of Discharge: Emergency Department Reason for ED Visit: Other: (Periorbital edema of left eye) How have you been since you were released from the hospital?: Better Any questions or concerns?: No  Items Reviewed: Did you receive and understand the discharge instructions provided?: Yes Medications obtained,verified, and reconciled?: Yes (Medications Reviewed) Any new allergies since your discharge?: No Dietary orders reviewed?: No Do you have support at home?: No  Medications Reviewed Today: Medications Reviewed Today     Reviewed by Eugenie Ulanda LITTIE, CMA (Certified Medical Assistant) on 03/16/24 at 1038  Med List Status: <None>   Medication Order Taking? Sig Documenting Provider Last Dose Status Informant  albuterol  (VENTOLIN  HFA) 108 (90 Base) MCG/ACT inhaler [id number] Yes Inhale 2 puffs into the lungs every 6 (six) hours as needed for wheezing or shortness of breath. Thedora Garnette HERO, MD  Active   amphetamine -dextroamphetamine  (ADDERALL) 30 MG tablet [id number] Yes Take 1 tablet by mouth 2 (two) times daily. Additional refills from pcp Thedora Garnette HERO, MD  Active   atorvastatin  (LIPITOR) 40 MG tablet [id number] Yes Take 1 tablet (40 mg total) by mouth daily. Thedora Garnette HERO, MD  Active   blood glucose meter kit and supplies [id number] Yes 1 each by Other route as directed. Dispense based on patient and insurance preference. Use up to four times daily as directed. (FOR ICD-10 E10.9, E11.9). Thedora Garnette HERO, MD  Active   budesonide -formoterol  (SYMBICORT ) 80-4.5 MCG/ACT inhaler [id number] Yes Inhale 2 puffs into the lungs 2 (two) times  daily. Thedora Garnette HERO, MD  Active   celecoxib  (CELEBREX ) 200 MG capsule [id number] Yes Take 1 capsule (200 mg total) by mouth 2 (two) times daily as needed. Silver Wonda LABOR, PA  Active   cyclobenzaprine  (FLEXERIL ) 10 MG tablet [id number] Yes Take 1 tablet (10 mg total) by mouth 2 (two) times daily as needed for muscle spasms. Silver Wonda A, [place]  Active   EPINEPHrine 0.3 mg/0.3 mL IJ SOAJ injection [id number] Yes Inject 0.3 mg into the muscle as needed. [provider]  Active   FLUoxetine  (PROZAC ) 40 MG capsule [id number] Yes Take 1 capsule (40 mg total) by mouth daily. Thedora Garnette HERO, MD  Active   gabapentin  (NEURONTIN ) 300 MG capsule [id number] Yes Take 1 capsule (300 mg total) by mouth 3 (three) times daily. Thedora Garnette HERO, MD  Active   glucose blood (FREESTYLE LITE) test strip [id number] Yes 1 each by Other route as needed for other. Use as instructed Thedora Garnette HERO, MD  Active   hydrOXYzine  (ATARAX ) 25 MG tablet [id number] Yes Take 1 tablet (25 mg total) by mouth every 6 (six) hours as needed for anxiety. Thedora Garnette HERO, MD  Active   ipratropium (ATROVENT  HFA) 17 MCG/ACT inhaler [id number] Yes Inhale 2 puffs into the lungs every 6 (six) hours as needed for wheezing. Thedora Garnette HERO, MD  Active   lisinopril  (ZESTRIL ) 2.5 MG tablet [id number] Yes Take 1 tablet (2.5 mg total) by mouth daily. Thedora Garnette HERO, MD  Active   metFORMIN  (GLUCOPHAGE -XR) 500 MG 24 hr tablet [id number] Yes Take 1 tablet (500 mg total) by mouth 2 (two) times daily with a meal. Thedora Garnette HERO,  MD  Active   metroNIDAZOLE  (FLAGYL ) 500 MG tablet [id number] Yes Take 1 tablet (500 mg total) by mouth 2 (two) times daily. Thedora Garnette HERO, MD  Active   montelukast (SINGULAIR) 10 MG tablet [id number] Yes Take 10 mg by mouth every evening. [provider]  Active   naloxone [facility]) nasal spray 4 mg/0.1 mL [id number] Yes Place 1 spray into the nose once. [provider]  Active   norethindrone  (AYGESTIN ) 5 MG  tablet [id number] Yes Take 1 tablet (5 mg total) by mouth daily. Constant, Peggy, MD  Active   omeprazole  (PRILOSEC) 40 MG capsule [id number] Yes Take 1 capsule (40 mg total) by mouth daily. Thedora Garnette HERO, MD  Active   ondansetron  (ZOFRAN -ODT) 4 MG disintegrating tablet [id number] Yes Take 1 tablet (4 mg total) by mouth every 6 (six) hours. Thedora Garnette HERO, MD  Active   oxyCODONE  (ROXICODONE ) 15 MG immediate release tablet [id number] Yes Take 15 mg by mouth 3 (three) times daily as needed. [provider]  Active   Oxycodone  HCl 10 MG TABS [id number] Yes Take 1 tablet (10 mg total) by mouth in the morning, at noon, and at bedtime. Thedora Garnette HERO, MD  Active   Semaglutide , 1 MG/DOSE, (OZEMPIC , 1 MG/DOSE,) 4 MG/3ML SOPN [id number] Yes Inject 1 mg into the skin once a week. Thedora Garnette HERO, MD  Active   zolpidem  (AMBIEN ) 10 MG tablet [id number] Yes Take 1 tablet (10 mg total) by mouth at bedtime as needed for sleep. Thedora Garnette HERO, MD  Active             Home Care and Equipment/Supplies: Were Home Health Services Ordered?: No Any new equipment or medical supplies ordered?: No  Functional Questionnaire: Do you need assistance with bathing/showering or dressing?: No Do you need assistance with meal preparation?: No Do you need assistance with eating?: No Do you have difficulty maintaining continence: No Do you need assistance with getting out of bed/getting out of a chair/moving?: No Do you have difficulty managing or taking your medications?: No  Follow up appointments reviewed: PCP Follow-up appointment confirmed?: NA Specialist Hospital Follow-up appointment confirmed?: NA Do you need transportation to your follow-up appointment?: No Do you understand care options if your condition(s) worsen?: Yes-patient verbalized understanding    SIGNATURE Ulanda Prose, RMA

## 2024-03-27 ENCOUNTER — Encounter: Attending: Physical Medicine and Rehabilitation | Admitting: Physical Medicine and Rehabilitation

## 2024-03-27 ENCOUNTER — Encounter: Payer: Self-pay | Admitting: Physical Medicine and Rehabilitation

## 2024-03-27 VITALS — BP 109/75 | HR 95 | Ht 64.0 in | Wt 163.8 lb

## 2024-03-27 DIAGNOSIS — M7062 Trochanteric bursitis, left hip: Secondary | ICD-10-CM | POA: Diagnosis not present

## 2024-03-27 DIAGNOSIS — G894 Chronic pain syndrome: Secondary | ICD-10-CM | POA: Insufficient documentation

## 2024-03-27 DIAGNOSIS — M1612 Unilateral primary osteoarthritis, left hip: Secondary | ICD-10-CM | POA: Insufficient documentation
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
## 2024-03-27 NOTE — Patient Instructions (Signed)
 Continue following with [facility] for pain medication per patient preference; she knows she is welcome to come back and see me if she ever wants to change this.  I will schedule you with Dr. Carilyn for L hip flouroscopic steroid injection  I will send a referral for orthopedics evaluation  Follow up as needed

## 2024-03-30 ENCOUNTER — Other Ambulatory Visit: Payer: Self-pay | Admitting: Family Medicine

## 2024-03-30 DIAGNOSIS — E1169 Type 2 diabetes mellitus with other specified complication: Secondary | ICD-10-CM

## 2024-03-30 DIAGNOSIS — F909 Attention-deficit hyperactivity disorder, unspecified type: Secondary | ICD-10-CM

## 2024-03-30 DIAGNOSIS — E785 Hyperlipidemia, unspecified: Secondary | ICD-10-CM

## 2024-03-30 NOTE — Telephone Encounter (Signed)
 Copied from CRM [id number]. Topic: Clinical - Medication Refill >> Mar 30, 2024 10:15 AM Chiquita SQUIBB wrote: Medication: atorvastatin  atorvastatin  (LIPITOR) 40 MG tablet  Ozempic  (1 MG/DOSE) Semaglutide , 1 MG/DOSE, (OZEMPIC , 1 MG/DOSE,) 4 MG/3ML SOPN  amphetamine -dextroamphetamine  amphetamine -dextroamphetamine  (ADDERALL) 30 MG tablet   Has the patient contacted their pharmacy? Yes (Agent: If no, request that the patient contact the pharmacy for the refill. If patient does not wish to contact the pharmacy document the reason why and proceed with request.) (Agent: If yes, when and what did the pharmacy advise?)  This is the patient's preferred pharmacy:  [facility] [phone] - [place], [place] - [address] AT [facility] [address] [place] [address] Phone: [phone] Fax: [phone]   Is this the correct pharmacy for this prescription? Yes If no, delete pharmacy and type the correct one.   Has the prescription been filled recently? No  Is the patient out of the medication? No  Has the patient been seen for an appointment in the last year OR does the patient have an upcoming appointment? Yes  Can we respond through MyChart? No  Agent: Please be advised that Rx refills may take up to 3 business days. We ask that you follow-up with your pharmacy.

## 2024-03-31 ENCOUNTER — Telehealth: Payer: Self-pay

## 2024-03-31 MED ORDER — OZEMPIC (1 MG/DOSE) 4 MG/3ML ~~LOC~~ SOPN
1.0000 mg | PEN_INJECTOR | SUBCUTANEOUS | 3 refills | Status: DC
Start: 2024-03-31 — End: 2024-07-06

## 2024-03-31 MED ORDER — AMPHETAMINE-DEXTROAMPHETAMINE 30 MG PO TABS: 30.0000 mg | ORAL_TABLET | Freq: Two times a day (BID) | ORAL | 0 refills | Status: DC

## 2024-03-31 MED ORDER — ATORVASTATIN CALCIUM 40 MG PO TABS
40.0000 mg | ORAL_TABLET | Freq: Every day | ORAL | 3 refills | Status: AC
Start: 2024-03-31 — End: ?

## 2024-03-31 NOTE — Telephone Encounter (Signed)
 Refill request for  Adderall 30 mg LR 03/02/24, #60, 0 rf  Atorvastatin   40 mg LR 04/16/23, #90, 3 rf  Ozempic  1 mg LR 05/20/23, 9 ml, 3 rf LOV  01/19/24 FOV 04/24/24   Please review and advise.  Thanks. Dm/cma

## 2024-03-31 NOTE — Telephone Encounter (Signed)
 Left VM at [phone] to discuss form received. Dm/cma

## 2024-04-05 NOTE — Telephone Encounter (Signed)
 Spoke to Tonya with [facility] regarding form we received.  She states patient reached out to them for help with bathing, getting dressed and house work.  I did advise that she was last seen 01/19/24 and would need an appointment since she was in the hospital on 03/12/24 and they will reach out to her to advise of this. Dm/cma

## 2024-04-24 ENCOUNTER — Ambulatory Visit: Payer: Self-pay | Admitting: Family Medicine

## 2024-04-24 ENCOUNTER — Ambulatory Visit: Admitting: Family Medicine

## 2024-04-24 ENCOUNTER — Encounter: Payer: Self-pay | Admitting: Family Medicine

## 2024-04-24 VITALS — BP 110/66 | HR 84 | Temp 97.1°F | Ht 64.0 in | Wt 167.4 lb

## 2024-04-24 DIAGNOSIS — I10 Essential (primary) hypertension: Secondary | ICD-10-CM

## 2024-04-24 DIAGNOSIS — F909 Attention-deficit hyperactivity disorder, unspecified type: Secondary | ICD-10-CM | POA: Diagnosis not present

## 2024-04-24 DIAGNOSIS — E1169 Type 2 diabetes mellitus with other specified complication: Secondary | ICD-10-CM

## 2024-04-24 DIAGNOSIS — E785 Hyperlipidemia, unspecified: Secondary | ICD-10-CM

## 2024-04-24 DIAGNOSIS — Z7984 Long term (current) use of oral hypoglycemic drugs: Secondary | ICD-10-CM

## 2024-04-24 DIAGNOSIS — M164 Bilateral post-traumatic osteoarthritis of hip: Secondary | ICD-10-CM

## 2024-04-24 DIAGNOSIS — Z7985 Long-term (current) use of injectable non-insulin antidiabetic drugs: Secondary | ICD-10-CM

## 2024-04-24 LAB — URINALYSIS, ROUTINE W REFLEX MICROSCOPIC
Bilirubin Urine: NEGATIVE
Hgb urine dipstick: NEGATIVE
Ketones, ur: NEGATIVE
Leukocytes,Ua: NEGATIVE
Nitrite: NEGATIVE
RBC / HPF: NONE SEEN (ref 0–?)
Specific Gravity, Urine: <=1.005 — AB (ref 1.000–1.030)
Total Protein, Urine: NEGATIVE
Urine Glucose: NEGATIVE
Urobilinogen, UA: 0.2 (ref 0.0–1.0)
pH: 7 (ref 5.0–8.0)

## 2024-04-24 LAB — LIPID PANEL
Cholesterol: 157 mg/dL (ref 0–200)
HDL: 67.5 mg/dL (ref >39.00)
LDL Cholesterol: 71 mg/dL (ref 0–99)
NonHDL: 89.92
Total CHOL/HDL Ratio: 2
Triglycerides: 93 mg/dL (ref 0.0–149.0)
VLDL: 18.6 mg/dL (ref 0.0–40.0)

## 2024-04-24 LAB — BASIC METABOLIC PANEL WITH GFR
BUN: 10 mg/dL (ref 6–23)
CO2: 28 meq/L (ref 19–32)
Calcium: 9.5 mg/dL (ref 8.4–10.5)
Chloride: 105 meq/L (ref 96–112)
Creatinine, Ser: 0.77 mg/dL (ref 0.40–1.20)
GFR: 92.32 mL/min (ref >60.00)
Glucose, Bld: 87 mg/dL (ref 70–99)
Potassium: 4.2 meq/L (ref 3.5–5.1)
Sodium: 138 meq/L (ref 135–145)

## 2024-04-24 LAB — MICROALBUMIN / CREATININE URINE RATIO
Creatinine,U: 24 mg/dL
Microalb Creat Ratio: UNDETERMINED mg/g (ref 0.0–30.0)
Microalb, Ur: <0.7 mg/dL

## 2024-04-24 LAB — HEMOGLOBIN A1C: Hgb A1c MFr Bld: 5.6 % (ref 4.6–6.5)

## 2024-04-24 MED ORDER — AMPHETAMINE-DEXTROAMPHETAMINE 30 MG PO TABS: 30.0000 mg | ORAL_TABLET | Freq: Two times a day (BID) | ORAL | 0 refills | Status: DC
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
## 2024-04-24 NOTE — Assessment & Plan Note (Signed)
 I will check annual DM labs today. Continue metformin  500 mg 2 tabs bid and Ozempic  1 mg weekly.

## 2024-04-24 NOTE — Assessment & Plan Note (Signed)
 Blood pressure is at goal today. Continue lisinopril  2.5 mg daily.

## 2024-04-24 NOTE — Assessment & Plan Note (Signed)
Stable. Continue Adderall 30 mg bid.

## 2024-04-24 NOTE — Assessment & Plan Note (Signed)
 Continue working with [facility] regarding pain management. She will follow through for a steroid injection and will continue to work with orthopedics regarding eventual hip joint replacement.

## 2024-04-24 NOTE — Assessment & Plan Note (Signed)
Lipids are at goal. Continue atorvastatin 40 mg daily.

## 2024-05-15 ENCOUNTER — Telehealth: Payer: Self-pay | Admitting: Physical Medicine & Rehabilitation

## 2024-05-15 NOTE — Telephone Encounter (Signed)
 P called and stated Yolanda Olson told her to call back with medication name so it won't be a drug interaction  Dexethasone

## 2024-05-16 NOTE — Telephone Encounter (Signed)
 She is scheduled for a L hip injection with Dr Princeton 10/2 and was calling to say she got what sounds like a trochanteric bursa injection at [facility] 9/17. I told her your appointment would likely need rescheduled; I usually say 6 weeks between injections but will let Dr. Carilyn decide his own comfort level here. Once we have a timeline, please call the patient back to reschedule. Thanks!

## 2024-05-17 NOTE — Telephone Encounter (Signed)
 Please reschedule her for early November per Dr Carilyn. Thanks!

## 2024-05-18 ENCOUNTER — Encounter: Admitting: Physical Medicine & Rehabilitation

## 2024-05-25 ENCOUNTER — Other Ambulatory Visit: Payer: Self-pay | Admitting: Family Medicine

## 2024-05-25 ENCOUNTER — Ambulatory Visit: Admitting: Obstetrics & Gynecology

## 2024-05-25 DIAGNOSIS — F909 Attention-deficit hyperactivity disorder, unspecified type: Secondary | ICD-10-CM

## 2024-05-25 NOTE — Telephone Encounter (Unsigned)
 Copied from CRM [id number]. Topic: Clinical - Medication Refill >> May 25, 2024  3:17 PM Franky GRADE wrote: Medication: amphetamine -dextroamphetamine  (ADDERALL) 30 MG tablet [[id number]]  Has the patient contacted their pharmacy? No (Agent: If no, request that the patient contact the pharmacy for the refill. If patient does not wish to contact the pharmacy document the reason why and proceed with request.) (Agent: If yes, when and what did the pharmacy advise?)  This is the patient's preferred pharmacy:  [facility] [phone] - [place], [place] - [address] AT [facility] [address] [place] [address] Phone: [phone] Fax: [phone]    Is this the correct pharmacy for this prescription? Yes If no, delete pharmacy and type the correct one.   Has the prescription been filled recently? No  Is the patient out of the medication? No  Has the patient been seen for an appointment in the last year OR does the patient have an upcoming appointment? Yes  Can we respond through MyChart? Yes  Agent: Please be advised that Rx refills may take up to 3 business days. We ask that you follow-up with your pharmacy.

## 2024-05-26 MED ORDER — AMPHETAMINE-DEXTROAMPHETAMINE 30 MG PO TABS: 30.0000 mg | ORAL_TABLET | Freq: Two times a day (BID) | ORAL | 0 refills | Status: DC

## 2024-05-26 NOTE — Telephone Encounter (Signed)
 Refill request for  Adderall 30 mg LR 04/24/24, #60, 0 rf LOV  04/24/24 FOV 07/24/24  Please review and advise.  Thanks. Dm/cma

## 2024-05-30 ENCOUNTER — Other Ambulatory Visit: Payer: Self-pay | Admitting: Family Medicine

## 2024-05-30 DIAGNOSIS — J4489 Other specified chronic obstructive pulmonary disease: Secondary | ICD-10-CM

## 2024-06-08 ENCOUNTER — Ambulatory Visit: Admitting: Obstetrics

## 2024-06-19 ENCOUNTER — Ambulatory Visit: Admitting: Obstetrics

## 2024-06-19 ENCOUNTER — Other Ambulatory Visit: Payer: Self-pay | Admitting: Family Medicine

## 2024-06-19 DIAGNOSIS — F909 Attention-deficit hyperactivity disorder, unspecified type: Secondary | ICD-10-CM

## 2024-06-19 DIAGNOSIS — I10 Essential (primary) hypertension: Secondary | ICD-10-CM

## 2024-06-19 DIAGNOSIS — E1169 Type 2 diabetes mellitus with other specified complication: Secondary | ICD-10-CM

## 2024-06-19 MED ORDER — METFORMIN HCL ER 500 MG PO TB24: 500.0000 mg | ORAL_TABLET | Freq: Two times a day (BID) | ORAL | 3 refills | Status: DC

## 2024-06-19 MED ORDER — AMPHETAMINE-DEXTROAMPHETAMINE 30 MG PO TABS: 30.0000 mg | ORAL_TABLET | Freq: Two times a day (BID) | ORAL | 0 refills | Status: DC

## 2024-06-19 MED ORDER — LISINOPRIL 2.5 MG PO TABS: 2.5000 mg | ORAL_TABLET | Freq: Every day | ORAL | 3 refills | Status: DC

## 2024-06-19 NOTE — Telephone Encounter (Signed)
 Refill request for Adderall 30 mg LR  05/26/24, #60, 0 rf  Lisinopril  2.5 mg LR  04/16/23, #90, 3 rf  Metformin  500 mg LR  04/16/23, #180, 3 rf LOV  04/24/24 FOV  07/24/24  Please review and advise.  Thanks. Dm/cma

## 2024-06-19 NOTE — Telephone Encounter (Signed)
 Copied from CRM [id number]. Topic: Clinical - Medication Refill >> Jun 19, 2024 10:53 AM Burnard DEL wrote: Medication: amphetamine -dextroamphetamine  (ADDERALL) 30 MG tablet  metFORMIN  (GLUCOPHAGE -XR) 500 MG 24 hr tablet  lisinopril  (ZESTRIL ) 2.5 MG tablet Has the patient contacted their pharmacy? No (Agent: If no, request that the patient contact the pharmacy for the refill. If patient does not wish to contact the pharmacy document the reason why and proceed with request.) (Agent: If yes, when and what did the pharmacy advise?)  This is the patient's preferred pharmacy:    [id number] - [place], [place] - [address] AT [facility] OF [address] [place] [address] Phone: [phone] Fax: [phone]  Is this the correct pharmacy for this prescription? Yes If no, delete pharmacy and type the correct one.   Has the prescription been filled recently? No  Is the patient out of the medication? Yes  Has the patient been seen for an appointment in the last year OR does the patient have an upcoming appointment? Yes  Can we respond through MyChart? No  Agent: Please be advised that Rx refills may take up to 3 business days. We ask that you follow-up with your pharmacy.

## 2024-06-20 ENCOUNTER — Telehealth (HOSPITAL_BASED_OUTPATIENT_CLINIC_OR_DEPARTMENT_OTHER): Payer: Self-pay | Admitting: *Deleted

## 2024-06-20 ENCOUNTER — Encounter: Attending: Physical Medicine and Rehabilitation | Admitting: Physical Medicine & Rehabilitation

## 2024-06-20 DIAGNOSIS — M1612 Unilateral primary osteoarthritis, left hip: Secondary | ICD-10-CM | POA: Insufficient documentation

## 2024-06-20 DIAGNOSIS — M7062 Trochanteric bursitis, left hip: Secondary | ICD-10-CM | POA: Insufficient documentation

## 2024-06-20 DIAGNOSIS — G894 Chronic pain syndrome: Secondary | ICD-10-CM | POA: Insufficient documentation

## 2024-06-20 NOTE — Telephone Encounter (Signed)
   Pre-operative Risk Assessment    Patient Name: Yolanda Olson  DOB: 05-Jun-1976 MRN: [id number]   Date of last office visit: NONE Date of next office visit: NONE-PT NEEDS NEW PT APPT FOR PREOP. WILL HAVE PREOP APP ADDRESS IF MD OR HF1ST PROVIDER   Request for Surgical Clearance    Procedure:  LEFT TOTAL HIP ARHTROPLASTY  Date of Surgery:  Clearance TBD                                Surgeon:  DR. REDELL SHOALS Surgeon's Group or Practice Name:  [facility] Phone number:  [phone] JOEN SIC Fax number:  [phone]   Type of Clearance Requested:   - Medical    Type of Anesthesia:  Spinal   Additional requests/questions:    [name]   06/20/2024, 4:52 PM

## 2024-06-20 NOTE — Telephone Encounter (Signed)
 Patient notified VIA phone. Dm/cma

## 2024-06-21 NOTE — Telephone Encounter (Signed)
   Name: Yolanda Olson  DOB: Sep 11, 1975  MRN: [id number]  Primary Cardiologist: None  Chart reviewed as part of pre-operative protocol coverage. Because of [name]'s past medical history and time since last visit, she will require a follow-up in-office visit in order to better assess preoperative cardiovascular risk. NEW PATIENT. Ok for [facility].  Pre-op covering staff: - Please schedule appointment and call patient to inform them. If patient already had an upcoming appointment within acceptable timeframe, please add pre-op clearance to the appointment notes so provider is aware. - Please contact requesting surgeon's office via preferred method (i.e, phone, fax) to inform them of need for appointment prior to surgery.  Patient is not on anticoagulation or antiplatelet per review of current medical record in Epic.    Lamarr Satterfield, NP  06/21/2024, 9:49 AM

## 2024-06-21 NOTE — Telephone Encounter (Signed)
 Tried contacting patient to schedule IN OFFICE patient is going to call back and schedule

## 2024-06-22 NOTE — Telephone Encounter (Signed)
 Left message to call back to schedule NEW PT APPT ON HF 1ST PROVIDER ok per preop APP Lamarr Satterfield, DNP.

## 2024-06-23 NOTE — Telephone Encounter (Signed)
 3rd attempt to reach the pt to schedule a NEW PT APPT ON [facility] 1ST SCHEDULE.     I will update the requesting office the pt needs to call back to schedule a new pt appt.   I will remove from the preop call back at this time

## 2024-06-26 ENCOUNTER — Encounter: Payer: Self-pay | Admitting: Obstetrics

## 2024-06-26 ENCOUNTER — Ambulatory Visit (INDEPENDENT_AMBULATORY_CARE_PROVIDER_SITE_OTHER): Admitting: Obstetrics

## 2024-06-26 VITALS — BP 109/76 | HR 79 | Ht 66.0 in | Wt 164.2 lb

## 2024-06-26 DIAGNOSIS — N760 Acute vaginitis: Secondary | ICD-10-CM

## 2024-06-26 DIAGNOSIS — N970 Female infertility associated with anovulation: Secondary | ICD-10-CM | POA: Diagnosis not present

## 2024-06-26 DIAGNOSIS — B9689 Other specified bacterial agents as the cause of diseases classified elsewhere: Secondary | ICD-10-CM

## 2024-06-26 DIAGNOSIS — N839 Noninflammatory disorder of ovary, fallopian tube and broad ligament, unspecified: Secondary | ICD-10-CM

## 2024-06-26 MED ORDER — CLOMIPHENE CITRATE 50 MG PO TABS: 50.0000 mg | ORAL_TABLET | Freq: Every day | ORAL | 0 refills | Status: AC

## 2024-06-26 MED ORDER — METRONIDAZOLE 500 MG PO TABS: 500.0000 mg | ORAL_TABLET | Freq: Two times a day (BID) | ORAL | 5 refills | Status: DC

## 2024-06-26 NOTE — Progress Notes (Signed)
 Pt requesting refill of [place]. [facility] does not take her insurance. Requesting other options to conceive.
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
## 2024-07-06 ENCOUNTER — Other Ambulatory Visit: Payer: Self-pay | Admitting: Family Medicine

## 2024-07-06 DIAGNOSIS — F909 Attention-deficit hyperactivity disorder, unspecified type: Secondary | ICD-10-CM

## 2024-07-06 DIAGNOSIS — E1169 Type 2 diabetes mellitus with other specified complication: Secondary | ICD-10-CM

## 2024-07-06 NOTE — Telephone Encounter (Signed)
 Copied from CRM [id number]. Topic: Clinical - Medication Refill >> Jul 06, 2024 10:47 AM Thersia C wrote: Medication: amphetamine -dextroamphetamine  (ADDERALL) 30 MG tablet Semaglutide , 1 MG/DOSE, (OZEMPIC , 1 MG/DOSE,) 4 MG/3ML SOPN  Patient stated she is completely out and needs it as soon as possible   Has the patient contacted their pharmacy? Yes (Agent: If no, request that the patient contact the pharmacy for the refill. If patient does not wish to contact the pharmacy document the reason why and proceed with request.) (Agent: If yes, when and what did the pharmacy advise?)  This is the patient's preferred pharmacy:  [facility] [phone] - [place], [place] - [address] AT [facility] [address] [place] [address] Phone: [phone] Fax: [phone]   Is this the correct pharmacy for this prescription? Yes If no, delete pharmacy and type the correct one.   Has the prescription been filled recently? No  Is the patient out of the medication? Yes  Has the patient been seen for an appointment in the last year OR does the patient have an upcoming appointment? Yes  Can we respond through MyChart? Yes  Agent: Please be advised that Rx refills may take up to 3 business days. We ask that you follow-up with your pharmacy.

## 2024-07-06 NOTE — Telephone Encounter (Signed)
 Refill request for  Adderall 30 mg LR 06/19/24, #60, 0 rf  Ozempic   LR 03/31/24, 9 ml, 3 rf LOV  04/24/24 FOV 07/24/24  Please review and advise.  Thanks. Dm/cma

## 2024-07-07 ENCOUNTER — Other Ambulatory Visit (HOSPITAL_COMMUNITY): Payer: Self-pay

## 2024-07-07 ENCOUNTER — Telehealth: Payer: Self-pay

## 2024-07-07 MED ORDER — AMPHETAMINE-DEXTROAMPHETAMINE 30 MG PO TABS: 30.0000 mg | ORAL_TABLET | Freq: Two times a day (BID) | ORAL | 0 refills | Status: DC

## 2024-07-07 MED ORDER — OZEMPIC (1 MG/DOSE) 4 MG/3ML ~~LOC~~ SOPN: 1.0000 mg | PEN_INJECTOR | SUBCUTANEOUS | 3 refills | Status: AC

## 2024-07-07 NOTE — Telephone Encounter (Signed)
 Pharmacy Patient Advocate Encounter  Received notification from [facility] MEDICAID that Prior Authorization for Ozempic  (1 MG/DOSE) 4MG /3ML pen-injectors  has been DENIED.  Full denial letter will be uploaded to the media tab. See denial reason below.   PA #/Case ID/Reference #: [id number]

## 2024-07-07 NOTE — Telephone Encounter (Signed)
 ERROR

## 2024-07-07 NOTE — Telephone Encounter (Signed)
 Pharmacy Patient Advocate Encounter   Received notification from Onbase that prior authorization for Ozempic  (1 MG/DOSE) 4MG /3ML pen-injectors is required/requested.   Insurance verification completed.   The patient is insured through [facility] MEDICAID.   Per test claim: PA required; PA submitted to above mentioned insurance via Latent Key/confirmation #/EOC [id number] Status is pending

## 2024-07-10 NOTE — Telephone Encounter (Signed)
 She was on the medication before coming to us  when in [place] .   So not sure how to get that information?

## 2024-07-11 NOTE — Telephone Encounter (Signed)
 Patient returned call;  she was aware that we needed prior BS readings A1C from previous provider.  She will reach out to them and have them fax the records to us .  Gave her the fax number [phone] to give to them.  Once we get wee will retry the PA again. Dm/cma.

## 2024-07-11 NOTE — Telephone Encounter (Signed)
 Unable to leave VM due to it is full.  Sent a message to patients phone.   Dm/cma

## 2024-07-24 ENCOUNTER — Ambulatory Visit: Admitting: Family Medicine

## 2024-07-24 ENCOUNTER — Ambulatory Visit: Payer: Self-pay | Admitting: Family Medicine

## 2024-07-24 VITALS — BP 118/68 | HR 85 | Temp 98.1°F | Ht 66.0 in | Wt 158.4 lb

## 2024-07-24 DIAGNOSIS — J3089 Other allergic rhinitis: Secondary | ICD-10-CM

## 2024-07-24 DIAGNOSIS — J309 Allergic rhinitis, unspecified: Secondary | ICD-10-CM | POA: Insufficient documentation

## 2024-07-24 DIAGNOSIS — M1990 Unspecified osteoarthritis, unspecified site: Secondary | ICD-10-CM

## 2024-07-24 DIAGNOSIS — M164 Bilateral post-traumatic osteoarthritis of hip: Secondary | ICD-10-CM

## 2024-07-24 DIAGNOSIS — Z7985 Long-term (current) use of injectable non-insulin antidiabetic drugs: Secondary | ICD-10-CM

## 2024-07-24 DIAGNOSIS — E1169 Type 2 diabetes mellitus with other specified complication: Secondary | ICD-10-CM

## 2024-07-24 DIAGNOSIS — J4489 Other specified chronic obstructive pulmonary disease: Secondary | ICD-10-CM

## 2024-07-24 DIAGNOSIS — Z23 Encounter for immunization: Secondary | ICD-10-CM

## 2024-07-24 DIAGNOSIS — Z7984 Long term (current) use of oral hypoglycemic drugs: Secondary | ICD-10-CM

## 2024-07-24 DIAGNOSIS — E785 Hyperlipidemia, unspecified: Secondary | ICD-10-CM

## 2024-07-24 DIAGNOSIS — I10 Essential (primary) hypertension: Secondary | ICD-10-CM

## 2024-07-24 DIAGNOSIS — R110 Nausea: Secondary | ICD-10-CM

## 2024-07-24 DIAGNOSIS — F419 Anxiety disorder, unspecified: Secondary | ICD-10-CM

## 2024-07-24 DIAGNOSIS — K219 Gastro-esophageal reflux disease without esophagitis: Secondary | ICD-10-CM

## 2024-07-24 DIAGNOSIS — F909 Attention-deficit hyperactivity disorder, unspecified type: Secondary | ICD-10-CM

## 2024-07-24 LAB — GLUCOSE, RANDOM: Glucose, Bld: 87 mg/dL (ref 70–99)

## 2024-07-24 LAB — HEMOGLOBIN A1C: Hgb A1c MFr Bld: 5.3 % (ref 4.6–6.5)

## 2024-07-24 LAB — C-REACTIVE PROTEIN: CRP: <0.5 mg/dL (ref 0.5–20.0)

## 2024-07-24 LAB — SEDIMENTATION RATE: Sed Rate: 8 mm/h (ref 0–20)

## 2024-07-24 MED ORDER — FLUTICASONE PROPIONATE 50 MCG/ACT NA SUSP: 2.0000 | Freq: Every day | NASAL | 6 refills | Status: AC

## 2024-07-24 MED ORDER — BUDESONIDE-FORMOTEROL FUMARATE 80-4.5 MCG/ACT IN AERO: 2.0000 | INHALATION_SPRAY | Freq: Two times a day (BID) | RESPIRATORY_TRACT | 11 refills | Status: AC

## 2024-07-24 MED ORDER — HYDROXYZINE HCL 25 MG PO TABS: 25.0000 mg | ORAL_TABLET | Freq: Four times a day (QID) | ORAL | 5 refills | Status: AC | PRN

## 2024-07-24 MED ORDER — PROMETHAZINE HCL 25 MG PO TABS: 25.0000 mg | ORAL_TABLET | Freq: Three times a day (TID) | ORAL | 0 refills | Status: AC | PRN

## 2024-07-24 MED ORDER — ATROVENT HFA 17 MCG/ACT IN AERS: 2.0000 | INHALATION_SPRAY | Freq: Four times a day (QID) | RESPIRATORY_TRACT | 12 refills | Status: AC | PRN

## 2024-07-24 MED ORDER — LISINOPRIL 2.5 MG PO TABS: 2.5000 mg | ORAL_TABLET | Freq: Every day | ORAL | 3 refills | Status: AC

## 2024-07-24 MED ORDER — METFORMIN HCL ER 500 MG PO TB24: 500.0000 mg | ORAL_TABLET | Freq: Two times a day (BID) | ORAL | 3 refills | Status: AC

## 2024-07-24 MED ORDER — OMEPRAZOLE 40 MG PO CPDR: 40.0000 mg | DELAYED_RELEASE_CAPSULE | Freq: Every day | ORAL | 3 refills | Status: AC

## 2024-07-24 NOTE — Assessment & Plan Note (Signed)
 I will renew her hydroxyzine  25 mg daily.

## 2024-07-24 NOTE — Assessment & Plan Note (Signed)
 Blood pressure is at goal today. Continue lisinopril  2.5 mg daily.

## 2024-07-24 NOTE — Assessment & Plan Note (Signed)
Lipids are at goal. Continue atorvastatin 40 mg daily.

## 2024-07-24 NOTE — Assessment & Plan Note (Signed)
 Yolanda Olson has symptoms of non-seasonal allergic rhinitis. She has previously been on fluticasone  nasal spray. Her current bottle expired 2 1/2 years ago. I will renew her fluticasone  and recommend she use this daily.

## 2024-07-24 NOTE — Assessment & Plan Note (Addendum)
 Continue working with [name] regarding pain management. She is seeking clearances for her surgery. Her pain physician has already signed for her medical clearance. I let Yolanda Olson know that cardiology has notes in her chart that they have been reaching out regarding an appointment for cardiac clearance. I will have my MA provide them with the phone number for cardiology.

## 2024-07-24 NOTE — Assessment & Plan Note (Addendum)
 A1c at goal. Continue metformin  500 mg 2 tabs bid and Ozempic  1 mg weekly. I will recheck her A1c today.

## 2024-07-24 NOTE — Assessment & Plan Note (Signed)
 Stable.  Continue  omeprazole 40mg  daily
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
## 2024-07-24 NOTE — Assessment & Plan Note (Signed)
 Stable. Continue budesonide -formoterol  (Symbicort ) 2 puffs bid, ipratropium 2 puffs q 6 hours, and PRN albuterol .

## 2024-07-24 NOTE — Assessment & Plan Note (Signed)
Stable. Continue Adderall 30 mg bid.

## 2024-07-26 LAB — RHEUMATOID FACTOR: Rheumatoid fact SerPl-aCnc: <10 [IU]/mL (ref <14)

## 2024-07-26 LAB — CYCLIC CITRUL PEPTIDE ANTIBODY, IGG: Cyclic Citrullin Peptide Ab: <16 U

## 2024-07-31 ENCOUNTER — Other Ambulatory Visit: Payer: Self-pay | Admitting: Family Medicine

## 2024-07-31 DIAGNOSIS — F909 Attention-deficit hyperactivity disorder, unspecified type: Secondary | ICD-10-CM

## 2024-07-31 MED ORDER — AMPHETAMINE-DEXTROAMPHETAMINE 30 MG PO TABS: 30.0000 mg | ORAL_TABLET | Freq: Two times a day (BID) | ORAL | 0 refills | Status: DC

## 2024-07-31 NOTE — Telephone Encounter (Signed)
 Copied from CRM #[id number]. Topic: Clinical - Medication Refill >> Jul 31, 2024 11:00 AM Burnard DEL wrote: Medication: amphetamine -dextroamphetamine  (ADDERALL) 30 MG tablet  Has the patient contacted their pharmacy? No (Agent: If no, request that the patient contact the pharmacy for the refill. If patient does not wish to contact the pharmacy document the reason why and proceed with request.) (Agent: If yes, when and what did the pharmacy advise?)  This is the patient's preferred pharmacy:  [facility] [phone] - [place], [place] - [address] AT [facility] [address] [place] [address] Phone: [phone] Fax: [phone]  Is this the correct pharmacy for this prescription? Yes If no, delete pharmacy and type the correct one.   Has the prescription been filled recently? No  Is the patient out of the medication? No(1 week left)  Has the patient been seen for an appointment in the last year OR does the patient have an upcoming appointment? Yes  Can we respond through MyChart? Yes  Agent: Please be advised that Rx refills may take up to 3 business days. We ask that you follow-up with your pharmacy.

## 2024-07-31 NOTE — Telephone Encounter (Signed)
 Requesting: amphetamine -dextroamphetamine  (ADDERALL) 30 MG tablet  Last Visit: 07/24/2024 Next Visit: Visit date not found Last Refill: 07/07/2024  Please Advise

## 2024-08-07 ENCOUNTER — Telehealth: Admitting: Obstetrics

## 2024-09-01 NOTE — Telephone Encounter (Signed)
 Duplicate request patient needs a new patient appointment. Patient will need to call out office to schedule an Office appointment.

## 2024-09-05 ENCOUNTER — Other Ambulatory Visit: Payer: Self-pay

## 2024-09-05 DIAGNOSIS — F909 Attention-deficit hyperactivity disorder, unspecified type: Secondary | ICD-10-CM

## 2024-09-05 MED ORDER — AMPHETAMINE-DEXTROAMPHETAMINE 30 MG PO TABS: 30.0000 mg | ORAL_TABLET | Freq: Two times a day (BID) | ORAL | 0 refills | Status: AC

## 2024-09-05 NOTE — Telephone Encounter (Signed)
 Copied from CRM [id number]. Topic: Clinical - Medication Refill >> Sep 04, 2024  1:10 PM Brittany M wrote: Medication: amphetamine -dextroamphetamine  (ADDERALL) 30 MG tablet ;  Semaglutide , 1 MG/DOSE, (OZEMPIC , 1 MG/DOSE,) 4 MG/3ML SOPN  Has the patient contacted their pharmacy? Yes (Agent: If no, request that the patient contact the pharmacy for the refill. If patient does not wish to contact the pharmacy document the reason why and proceed with request.) (Agent: If yes, when and what did the pharmacy advise?)  This is the patient's preferred pharmacy:  [facility] [phone] - [place],  - [address] AT [facility] OF [address] [place] [address] Phone: [phone] Fax: [phone]  Is this the correct pharmacy for this prescription? Yes If no, delete pharmacy and type the correct one.   Has the prescription been filled recently? Yes  Is the patient out of the medication? Yes  Has the patient been seen for an appointment in the last year OR does the patient have an upcoming appointment? Yes  Can we respond through MyChart? Yes  Agent: Please be advised that Rx refills may take up to 3 business days. We ask that you follow-up with your pharmacy.

## 2024-09-05 NOTE — Telephone Encounter (Signed)
 Refill request for  Adderall30 mg LR  07/31/24, #360, 0 rf LOV  07/24/24 FOV none scheduled.    Please review and advise.  Thanks. Dm/cma

## 2024-09-08 ENCOUNTER — Telehealth: Payer: Self-pay

## 2024-09-08 ENCOUNTER — Other Ambulatory Visit (HOSPITAL_COMMUNITY): Payer: Self-pay

## 2024-09-08 NOTE — Telephone Encounter (Signed)
 Pharmacy Patient Advocate Encounter   Received notification from CoverMyMeds that prior authorization for OZEMPIC  is required/requested.   Insurance verification completed.   The patient is insured through [facility] MEDICAID.   Per test claim: The current 84 day co-pay is, $4.  No PA needed at this time. This test claim was processed through [facility]- copay amounts may vary at other pharmacies due to pharmacy/plan contracts, or as the patient moves through the different stages of their insurance plan.

## 2024-10-16 ENCOUNTER — Ambulatory Visit: Admitting: Family Medicine
